# Patient Record
Sex: Female | Born: 1955
Health system: Southern US, Community
[De-identification: ages and names within clinical notes are randomized; demographics above are authoritative.]

## PROBLEM LIST (undated history)

## (undated) DIAGNOSIS — Z765 Malingerer [conscious simulation]: Secondary | ICD-10-CM

## (undated) DIAGNOSIS — G8929 Other chronic pain: Secondary | ICD-10-CM

## (undated) DIAGNOSIS — I509 Heart failure, unspecified: Secondary | ICD-10-CM

## (undated) DIAGNOSIS — M549 Dorsalgia, unspecified: Secondary | ICD-10-CM

## (undated) DIAGNOSIS — I10 Essential (primary) hypertension: Secondary | ICD-10-CM

## (undated) DIAGNOSIS — M1990 Unspecified osteoarthritis, unspecified site: Secondary | ICD-10-CM

## (undated) DIAGNOSIS — F1910 Other psychoactive substance abuse, uncomplicated: Secondary | ICD-10-CM

## (undated) DIAGNOSIS — J449 Chronic obstructive pulmonary disease, unspecified: Secondary | ICD-10-CM

## (undated) DIAGNOSIS — R569 Unspecified convulsions: Secondary | ICD-10-CM

## (undated) HISTORY — PX: CATARACT EXTRACTION: SUR2

## (undated) HISTORY — PX: CHOLECYSTECTOMY: SHX55

## (undated) HISTORY — PX: HERNIA REPAIR: SHX51

---

## 2000-08-26 ENCOUNTER — Ambulatory Visit (HOSPITAL_COMMUNITY): Admission: RE | Admit: 2000-08-26 | Discharge: 2000-08-26 | Payer: Self-pay | Admitting: Internal Medicine

## 2000-08-26 ENCOUNTER — Encounter: Payer: Self-pay | Admitting: General Surgery

## 2001-09-03 ENCOUNTER — Encounter: Payer: Self-pay | Admitting: Internal Medicine

## 2001-09-03 ENCOUNTER — Ambulatory Visit (HOSPITAL_COMMUNITY): Admission: RE | Admit: 2001-09-03 | Discharge: 2001-09-03 | Payer: Self-pay | Admitting: Internal Medicine

## 2001-11-09 ENCOUNTER — Emergency Department (HOSPITAL_COMMUNITY): Admission: EM | Admit: 2001-11-09 | Discharge: 2001-11-10 | Payer: Self-pay | Admitting: Emergency Medicine

## 2002-09-14 ENCOUNTER — Ambulatory Visit (HOSPITAL_COMMUNITY): Admission: RE | Admit: 2002-09-14 | Discharge: 2002-09-14 | Payer: Self-pay | Admitting: Internal Medicine

## 2002-09-14 ENCOUNTER — Encounter: Payer: Self-pay | Admitting: Internal Medicine

## 2003-04-25 ENCOUNTER — Ambulatory Visit (HOSPITAL_COMMUNITY): Admission: RE | Admit: 2003-04-25 | Discharge: 2003-04-25 | Payer: Self-pay | Admitting: Internal Medicine

## 2003-12-14 ENCOUNTER — Ambulatory Visit (HOSPITAL_COMMUNITY): Admission: RE | Admit: 2003-12-14 | Discharge: 2003-12-14 | Payer: Self-pay | Admitting: Family Medicine

## 2004-11-19 ENCOUNTER — Ambulatory Visit (HOSPITAL_COMMUNITY): Admission: RE | Admit: 2004-11-19 | Discharge: 2004-11-19 | Payer: Self-pay | Admitting: Internal Medicine

## 2005-02-04 DIAGNOSIS — F1910 Other psychoactive substance abuse, uncomplicated: Secondary | ICD-10-CM

## 2005-02-04 DIAGNOSIS — Z765 Malingerer [conscious simulation]: Secondary | ICD-10-CM

## 2005-02-04 HISTORY — DX: Other psychoactive substance abuse, uncomplicated: F19.10

## 2005-02-04 HISTORY — DX: Malingerer (conscious simulation): Z76.5

## 2005-07-18 ENCOUNTER — Ambulatory Visit (HOSPITAL_COMMUNITY): Admission: RE | Admit: 2005-07-18 | Discharge: 2005-07-18 | Payer: Self-pay | Admitting: Internal Medicine

## 2005-09-11 ENCOUNTER — Emergency Department (HOSPITAL_COMMUNITY): Admission: EM | Admit: 2005-09-11 | Discharge: 2005-09-12 | Payer: Self-pay | Admitting: Emergency Medicine

## 2005-10-30 ENCOUNTER — Emergency Department (HOSPITAL_COMMUNITY): Admission: EM | Admit: 2005-10-30 | Discharge: 2005-10-30 | Payer: Self-pay | Admitting: Emergency Medicine

## 2005-11-03 ENCOUNTER — Inpatient Hospital Stay (HOSPITAL_COMMUNITY): Admission: EM | Admit: 2005-11-03 | Discharge: 2005-11-11 | Payer: Self-pay | Admitting: Emergency Medicine

## 2005-11-06 ENCOUNTER — Ambulatory Visit: Payer: Self-pay | Admitting: Cardiology

## 2005-12-16 ENCOUNTER — Inpatient Hospital Stay (HOSPITAL_COMMUNITY): Admission: EM | Admit: 2005-12-16 | Discharge: 2005-12-19 | Payer: Self-pay | Admitting: Emergency Medicine

## 2005-12-30 ENCOUNTER — Emergency Department (HOSPITAL_COMMUNITY): Admission: EM | Admit: 2005-12-30 | Discharge: 2005-12-30 | Payer: Self-pay | Admitting: Emergency Medicine

## 2006-08-11 ENCOUNTER — Inpatient Hospital Stay (HOSPITAL_COMMUNITY): Admission: AD | Admit: 2006-08-11 | Discharge: 2006-08-15 | Payer: Self-pay | Admitting: Psychiatry

## 2006-08-11 ENCOUNTER — Emergency Department (HOSPITAL_COMMUNITY): Admission: EM | Admit: 2006-08-11 | Discharge: 2006-08-11 | Payer: Self-pay | Admitting: Emergency Medicine

## 2006-08-11 ENCOUNTER — Ambulatory Visit: Payer: Self-pay | Admitting: Psychiatry

## 2006-09-25 ENCOUNTER — Ambulatory Visit: Payer: Self-pay | Admitting: Cardiology

## 2006-12-10 ENCOUNTER — Emergency Department (HOSPITAL_COMMUNITY): Admission: EM | Admit: 2006-12-10 | Discharge: 2006-12-10 | Payer: Self-pay | Admitting: Emergency Medicine

## 2010-02-25 ENCOUNTER — Encounter: Payer: Self-pay | Admitting: Internal Medicine

## 2010-06-19 NOTE — Consult Note (Signed)
NAME:  [name], [name]                 ACCOUNT NO.:  [id number]   MEDICAL RECORD NO.:  [id number]          PATIENT TYPE:  IPS   LOCATION:  0504                          FACILITY:  [facility]   PHYSICIAN:  Corinna L. Lendell Caprice, MDDATE OF BIRTH:  10-Nov-1955   DATE OF CONSULTATION:  08/14/2006  DATE OF DISCHARGE:                                 CONSULTATION   REQUESTING PHYSICIAN:  Geoffery Lyons, M.D.   REASON FOR CONSULTATION:  Uncontrolled hypertension   IMPRESSIONS AND RECOMMENDATIONS:  1. Uncontrolled hypertension:  The patient reports that she takes      clonidine 0.2 mg q.i.d. rather than b.i.d. which is what she is on      here.  I recommend changing to q.i.d.  She also was on [facility]      previously which I recommend resuming.  She reports that her blood      pressure even on this regimen has been high in the past and we may      need to adjust her medications accordingly.  2. Abnormal TSH:  I recommend checking a free T4, free T3 to rule out      hypothyroidism, but she does not have symptoms of hypothyroidism      clinically.  3. History of polysubstance abuse.  4. Degenerative joint disease.   HISTORY OF PRESENT ILLNESS:  [name] is a 55 year old white female who  was admitted to [facility] with complaints of wanting detox from  alcohol.  She has a history of chronic narcotic use but apparently has  been fired from her primary care physician and is unable to find anyone  who will prescribe her narcotics.  She reports that she started drinking  4 to 6 beers a day due to the pain as she was unable to get any pain  medications.  Apparently her primary care physician fired her for  illicit drug use.  She was found to have a urine drug screen positive  for benzodiazepines and THC during this hospitalization and apparently  has had issues with urine drug screens positive for cocaine in the past.  Currently she is complaining of back pain and leg pain.   PAST MEDICAL HISTORY:  As  above.   MEDICATIONS:  Multivitamin a day, thiamine, Librium taper, nicotine 21  mg daily, clonidine 0.2 mg p.o. b.i.d.  Neurontin as needed.  Seroquel  as needed.   SOCIAL HISTORY:  As above.  She smokes cigarettes.   FAMILY HISTORY:  Noncontributory.   REVIEW OF SYSTEMS:  As above, otherwise negative.   PHYSICAL EXAMINATION:  Vital signs:  Her temperature is 97.1, pulse 80,  respiratory rate 20, blood pressure most recently was 142/95, earlier  today was 201/128 and has ranged from 140 systolic to 208 systolic, 83  to 829 diastolic.  Pulse is 77, respiratory rate 20.  In general, the  patient is an anxious appearing, sometimes tearful white female who is  overweight.  HEENT:  Normocephalic, atraumatic.  Pupils are equal,  round, reactive to light.  Sclerae are anicteric.  Moist mucous  membranes.  Neck  is supple.  No carotid bruits.  Lungs are clear to  auscultation bilaterally without wheezes, rhonchi or rales.  CARDIOVASCULAR:  Regular rate and rhythm without murmurs, gallops or  rubs.  ABDOMEN:  Normal bowel sounds, soft, obese, nontender.  GU and  RECTAL:  Deferred.  EXTREMITIES:  No clubbing, cyanosis or edema.  NEUROLOGIC:  Alert and oriented.  Cranial nerves and sensorimotor exam  are intact.  SKIN:  No rash.   LABS:  TSH is 8.464.  CBC is significant for hemoglobin of 16,  hematocrit 48, otherwise unremarkable.  Basic metabolic panel  significant for a sodium 131, otherwise unremarkable.  Urine drug screen  as above.  Blood alcohol level on July 7th was 121.  Urinalysis showed  trace ketones, specific gravity less than 1.005, negative blood,  negative protein, negative nitrite, negative leukocyte esterase.   ASSESSMENT AND PLAN:  As above.      Corinna L. Lendell Caprice, MD  Electronically Signed     CLS/MEDQ  D:  08/14/2006  T:  08/15/2006  Job:  [id number]

## 2010-06-22 NOTE — Procedures (Signed)
NAMECOURTNEI, RUDDELL                 ACCOUNT NO.:  [id number]   MEDICAL RECORD NO.:  [id number]          PATIENT TYPE:  INP   LOCATION:  IC04                          FACILITY:  [facility]   PHYSICIAN:  Gerrit Friends. Dietrich Pates, MD, FACCDATE OF BIRTH:  1955-07-30   DATE OF PROCEDURE:  11/06/2005  DATE OF DISCHARGE:                                  ECHOCARDIOGRAM   CLINICAL DATA:  A 55 year old woman with respiratory failure and substance  abuse; rule out PFO.  M-mode aorta 2.9, left atrium 3.0, septum 1.6,  posterior wall 1.4, LV diastole 3.4, LV systole 2.4.   1. Technically suboptimal, but adequate echocardiographic study.  2. Normal left atrium, right atrium and right ventricle.  3. Normal mitral and tricuspid valves.  4. Normal aortic valve and proximal ascending aorta; high flow velocity      across the aortic valve by Doppler.  5. Normal internal dimension of the left ventricle; moderate LVH present;      hyperdynamic regional and global LV systolic function with cavity      obliteration noted.  6. Physiologic posterior pericardial effusion.  7. Normal IVC.  8. Contrast study was of poor quality; there was no evidence for shunting      at the atrial or ventricular level.      Gerrit Friends. Dietrich Pates, MD, [facility]  Electronically Signed     RMR/MEDQ  D:  11/06/2005  T:  11/07/2005  Job:  [id number]

## 2010-06-22 NOTE — Group Therapy Note (Signed)
[name], [name]                 ACCOUNT NO.:  [id number]   MEDICAL RECORD NO.:  [id number]          PATIENT TYPE:  INP   LOCATION:  A307                          FACILITY:  [facility]   PHYSICIAN:  Margaretmary Dys, M.D.DATE OF BIRTH:  Aug 24, 1955   DATE OF PROCEDURE:  11/10/2005  DATE OF DISCHARGE:                                   PROGRESS NOTE   SUBJECTIVE:  The patient still complains of pain everywhere.  She says her  back hurts.  She gave me a list of all the narcotics she is on, including  Dilaudid 8 mg every 4 hours, Roxicodone.  The patient would like to have  this given to her here.  I have advised her that she will need a pain clinic  consult as soon as she is discharged.  She used to be a patient of Dr. Sherwood Ellis  and was discharged from the practice due to concerns about her drug use.   OBJECTIVE:  Conscious, alert, comfortable, not in acute distress.  VITAL SIGNS:  Her blood pressure was 126/70, pulse of 84, respirations 20,  temperature 97.2, oxygen saturation was 93% on room air.  HEENT:  Normocephalic, atraumatic.  Oral mucosa was moist with no exudates.  NECK:  Supple.  No JVD.  LUNGS:  Clear equally.  Good air entry bilaterally.  HEART:  S1, S2 regular.  No S3, S4 gallops or rubs.  ABDOMEN:  Soft and nontender.  Bowel sounds positive.  EXTREMITIES:  No edema.   LABORATORY/DIAGNOSTIC DATA:  White blood cell count was 7.7, hemoglobin of  11.6, hematocrit 34.8, platelet count was 220,000 with no left shift.  BMET  was normal.   ASSESSMENT AND PLAN:  Acute respiratory failure secondary to drug overdose,  likely benzodiazepine combined with cocaine.  The patient is much better and  this is resolved.  She also had malignant hypertension likely related to  cocaine toxicity.  Her blood pressure medications have been restarted with  excellent control thus far.  The patient has chronic pain syndrome and has  been having a lot of difficulty with her pain control.  She is  requesting  multiple medications.   Her home situation is fairly bad and that is why we are holding off  discharging her until she is seen by social services tomorrow.  SHE is also  still undergoing physical and occupational therapy.   I would change her Dilaudid to every 3 hours.   Clearly, the patient's part of the overwhelming concern will be for her to  get a pain consult to help streamline her narcotic analgesia when she is  discharged.      Margaretmary Dys, M.D.  Electronically Signed     AM/MEDQ  D:  11/10/2005  T:  11/10/2005  Job:  [id number]

## 2010-06-22 NOTE — Group Therapy Note (Signed)
NAMEBRENTLEY, HORRELL                 ACCOUNT NO.:  [id number]   MEDICAL RECORD NO.:  [id number]          PATIENT TYPE:  INP   LOCATION:  A307                          FACILITY:  [facility]   PHYSICIAN:  Edward L. Juanetta Gosling, M.D.DATE OF BIRTH:  12/23/1955   DATE OF PROCEDURE:  11/08/2005  DATE OF DISCHARGE:                                   PROGRESS NOTE   Ms. Hosking is awake and alert, says she has significant pain and needs  something for it.  She has no other complaints.  She says she is breathing  okay.   Her pH 7.38, pCO2 of 45, pO2 of 124.  Her white count 10,900, hemoglobin  11.6, platelets 200,000.  Electrolytes show potassium 3.4.   ASSESSMENT:  She is doing fairly well.   PLAN:  I think from a strictly respiratory point of view she could move from  the intensive care unit.  She may need further evaluation by mental health,  etc.      Oneal Deputy. Juanetta Gosling, M.D.  Electronically Signed     ELH/MEDQ  D:  11/08/2005  T:  11/09/2005  Job:  [id number]

## 2010-06-22 NOTE — Consult Note (Signed)
NAME:  [name], [name]                 ACCOUNT NO.:  [id number]   MEDICAL RECORD NO.:  [id number]          PATIENT TYPE:  INP   LOCATION:  IC04                          FACILITY:  [facility]   PHYSICIAN:  Edward L. Juanetta Gosling, M.D.DATE OF BIRTH:  1955-06-05   DATE OF CONSULTATION:  11/05/2005  DATE OF DISCHARGE:                                   CONSULTATION   REASON FOR CONSULTATION:  Respiratory failure.   [name] is a 55 year old who was admitted to the hospital on the 30th who  had what may have been a seizure at her home.  When she was found by EMS,  she was unresponsive.  She received Narcan and then became alert.  She then  began having shaking of her hand and a grand mal seizure at that time.  Since that time, she was then brought to the emergency room.  She has not  been responsive since she came to the emergency room.  When she came to the  ER, she eventually was able to be sedated, paralyzed, intubated and placed  on mechanical ventilation.  Her examination in the emergency room also  showed that she had Xanax tablets in her underwear.  Urine tox screen at  that time showed cocaine.  There apparently has been a history of  polysubstance abuse, particularly narcotics and she has apparently been to  multiple emergency rooms seeking narcotics.  She was hypertensive when she  came to the emergency room and had been started on nitroglycerin.  She also  was noted at the time of admission to have a bruise on her chin and left  thigh.  She is still unresponsive and has had increasing air hunger despite  her ventilation.  She had become more hypoxic despite being on the  ventilator. Recently has been started on 100% O2.  Has been noted to have a  somewhat elevated D-dimer and has been anticoagulated.   Her past medical history all from the medical record.  It is positive for  hypertension, degenerative joint disease, depression, gastroesophageal  reflux disease, osteoarthritis, sleep apnea,  lumbar radiculitis and panic  disorder.  She had apparently been on the following medications and most of  the doses are not known.  She had been on Clonidine, Mavik, Roxicet,  Dilaudid, Xanax, Cymbalta, Prempro, Proscar.  She is allergic to PENICILLIN.  Her family and social history are pretty much unknown.  Review of systems  otherwise is also unknown.   Her physical examination now shows that she has a respiratory rate of about  30.  She is intubated on a ventilator and sedated.  Blood pressure is  128/80, pulse is about 90.  Her respirations as mentioned are about 30.  She  is afebrile.  Her HEENT shows her pupils are poorly reactive to light.  Her  mucous membranes are moist.  I cannot hear fundi.  Tympanic membranes are  intact.  Her neck is supple without masses.  She does not have any jugular  venous distention.  She does not have any carotid bruits.  Her chest shows  decreased breath sounds and rhonchi bilaterally more on the left then on the  right.  Her heart is regular without gallop.  Her abdomen is soft.  No  masses are felt.  Bowel sounds are present, sluggish.  No masses are  palpable.  Her extremities showed no edema.  CNS shows that she did not have  any focal findings but she was sedated.   Her hospital course thus far has been that she has remained sedated, been on  the ventilator, became had more problems with her oxygenation which is now  100% on O2.  She has been treated already with charcoal, sorbitol and her  chest x-ray now is actually fairly clear.  She is on CMV ventilation which  is appropriate without a significant  finding on chest x-ray.  I think we need to be concerned about pulmonary  embolus.  She had been anticoagulated which of course is appropriate.  All  of this may be related to acute cocaine intoxication.  She also had multiple  other substances found in her urine drug screen.  Thanks for allowing me to  her with you.      Edward L. Juanetta Gosling,  M.D.  Electronically Signed     ELH/MEDQ  D:  11/05/2005  T:  11/07/2005  Job:  [id number]

## 2010-06-22 NOTE — Group Therapy Note (Signed)
[name], [name]                 ACCOUNT NO.:  [id number]   MEDICAL RECORD NO.:  [id number]          PATIENT TYPE:  INP   LOCATION:  A307                          FACILITY:  [facility]   PHYSICIAN:  Edward L. Juanetta Gosling, M.D.DATE OF BIRTH:  [birth year]   DATE OF PROCEDURE:  11/07/2005  DATE OF DISCHARGE:                                   PROGRESS NOTE   [name] extubated herself today.  She has tolerated things well so far.  She says that she has a significant amount of pain and needs something for  pain and has very limited understanding that it was the use of these  medications that apparently got her into respiratory failure.  She says that  someone else gave her the cocaine that was in her system without her  knowledge.   PHYSICAL EXAMINATION:  GENERAL:  She is awake and alert, complaining of  pain.  CHEST:  Her chest is actually fairly clear.  CARDIAC:  Her heart is regular.  ABDOMEN:  Soft.  EXTREMITIES:  No edema.   ASSESSMENT:  Since she has been extubated and since thus far she is  tolerating it fairly well, I think we should go ahead and see how she does.  She may well now that she is over most of the polysubstance overdose be able  to manage off ventilator support.  She does have chronic obstructive  pulmonary disease, which may make things more difficult but, hopefully, she  will be able to do that.      Edward L. Juanetta Gosling, M.D.  Electronically Signed     ELH/MEDQ  D:  11/08/2005  T:  11/09/2005  Job:  [id number]

## 2010-06-22 NOTE — Group Therapy Note (Signed)
NAME:  [name], [name]                 ACCOUNT NO.:  [id number]   MEDICAL RECORD NO.:  [id number]          PATIENT TYPE:  INP   LOCATION:  IC04                          FACILITY:  [facility]   PHYSICIAN:  Edward L. Juanetta Gosling, M.D.DATE OF BIRTH:  1955/06/28   DATE OF PROCEDURE:  DATE OF DISCHARGE:                                   PROGRESS NOTE   [name] remains unresponsive. She is intubated on a ventilator. Her chest x-  ray this morning shows bibasilar atelectasis. She is schedule is scheduled  for CAT scan on her chest. She remains on Clonidine, Lovenox, Xopenex,  Atrovent, Levaquin, Cardene, Protonix, Hydralazine, Morphine, Diprivan, and  Apresoline.  Her heart rate is 105, her blood pressure about 160/80.   LABORATORY DATA:  Her lab work shows she is 80% FiO2, 600 rate of 14 shows  PA 7.47, pCO2 of 27, __________ 22 of 85. Magnesium 1.7, white count is  23,300, hemoglobin 15, platelets are 302 and B-met shows normal  electrolytes, glucose 117.   ASSESSMENT:  She has an exasperatory failure, that is multi-factorial,  probably a multiple drug overdose, plus use of illicit drugs such as  cocaine. She has hypertensive urgency, I suspect from the cocaine. She has  very poor oxygenation. She is scheduled for CAT scan today. She is going to  continue on her treatments, including Lovenox. She has a little bit more  atelectasis on the x-ray, so we are going to need to watch that. Otherwise,  we will continue the treatments and follow through.      Edward L. Juanetta Gosling, M.D.  Electronically Signed     ELH/MEDQ  D:  11/06/2005  T:  11/07/2005  Job:  [id number]

## 2010-06-22 NOTE — Group Therapy Note (Signed)
[name], [name]                 ACCOUNT NO.:  [id number]   MEDICAL RECORD NO.:  [id number]          PATIENT TYPE:  INP   LOCATION:  A307                          FACILITY:  [facility]   PHYSICIAN:  Margaretmary Dys, M.D.DATE OF BIRTH:  November 27, 1955   DATE OF PROCEDURE:  11/09/2005  DATE OF DISCHARGE:                                   PROGRESS NOTE   SUBJECTIVE:  The patient is doing much better.  She is awake, alert and  eating, has no other complaints, thinks somebody may have intentionally  given her the drugs to use, as she declines intentionally taking an  overdose.   OBJECTIVE:  Conscious, alert, comfortable, not in acute distress.  VITAL SIGNS:  Blood pressure was 127/72, pulse of 89, respiration of 20,  temperature 97.8.  HEENT:  Normocephalic, atraumatic.  Oral mucosa was moist with no exudates.  NECK:  Supple, no JVD or lymphadenopathy.  LUNGS:  Clear clinically, good air air bilaterally.  HEART:  S1, S2, regular, no S3, gallops or rubs.  ABDOMEN:  Soft, nontender, bowel sounds positive.  No masses palpable.  EXTREMITIES:  No pitting pedal edema and no calf effusion or tenderness was  noted.   LABORATORY/DIAGNOSTIC DATA:  White blood count was 7.9, hemoglobin 11.6,  hematocrit 34.6, platelet count was 200.  BMET shows a mild hypokalemia with  potassium of 3.4.   ASSESSMENT AND PLAN:  [name] is a female admitted with a drug overdose,  including cocaine and benzodiazepine.  The patient went into respiratory  failure, was subsequently intubated in the emergency room.  The patient has  done fairly well, was successfully extubated.  It is unclear what happened.  The patient does have a lot of social issues, including chronic pain  syndrome, for which she has been on narcotics for more than 10 years.  The  plan is to continue to watch her.  We will discontinue her telemetry.  We  will request physical  therapy/occupational therapy to see her, and ask social services to help  [place]  prior to her being discharged.  She is otherwise comfortable.  We will  replace her potassium and check a level in the morning.      Margaretmary Dys, M.D.  Electronically Signed     AM/MEDQ  D:  11/09/2005  T:  11/10/2005  Job:  [id number]
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
## 2010-06-22 NOTE — H&P (Signed)
NAME:  [name], [name]                 ACCOUNT NO.:  [id number]   MEDICAL RECORD NO.:  [id number]          PATIENT TYPE:  INP   LOCATION:  IC04                          FACILITY:  [facility]   PHYSICIAN:  Margaretmary Dys, M.D.DATE OF BIRTH:  [birth year]   DATE OF ADMISSION:  11/03/2005  DATE OF DISCHARGE:  LH                                HISTORY & PHYSICAL   PRIMARY CARE PHYSICIAN:  The patient is unassigned.   ADMISSION DIAGNOSES:  1. Altered mental status.  2. Acute respiratory failure requiring mechanical ventilation.  3. Probable cocaine toxicity plus polysubstance abuse.  4. History of severe depression.  5. Severe hypertensive crisis likely secondary to cocaine use.   CHIEF COMPLAINT:  Altered mental status.   HISTORY OF PRESENT ILLNESS:  Please note that information was not obtainable  from the patient as she was paralyzed, sedated and on mechanical ventilation  due to severe respiratory distress when she arrived in the emergency room.   Based on the records from the ED and from the EMS, the patient is a 55-year-  old Caucasian female who was in her residence where she reportedly had  symptoms consistent with a seizure.  Upon arrival by EMS, the patient was  noted to be unresponsive.  She received Narcan and became alert.  Her hand  began shaking and then she was noted to have a grand mal seizure.  The  patient reported not to be responsive since.  Attempts were made to intubate  in the field but her jaws were clenched.   On arrival here, Dr. Hilario Quarry, the emergency room physician,  subsequently intubated her.  They found some tablets of Xanax in her  underpants.   A follow-up evaluation including urine toxicology screen is positive for  cocaine and the results are discussed below.  The patient has had history of  polysubstance abuse in the past, especially to narcotic analgesia and has  had a few emergency room visits including most recently to [facility]  requesting for narcotic analgesia.  She used to be a patient of Dr.  Sherwood Gambler but apparently Dr. Sherwood Gambler declined seeing her anymore because of drug  and substance abuse problem.  The patient was also noted to be severely  hypertensive and was started on nitroglycerin infusion with some improvement  in her blood pressure.   She also has a bruise on her chin and left thigh.   REVIEW OF SYSTEMS:  Not obtainable due to patient's mental status.   PAST MEDICAL HISTORY:  1. From the records, hypertension.  2. Degenerative joint disease.  3. Depression.  4. Gastroesophageal reflux disease.  5. Osteoarthritis.  6. Sleep apnea.  7. Lumbar radiculitis.  8. Panic disorder.   MEDICATIONS:  She is on Clonidine orally, dose unknown, Mavik orally,  Roxicet 30 mg, Dilaudid oral 8 mg, Xanax oral, Cymbalta oral, Prempro oral,  Clonidine oral and Mavik oral.   ALLERGIES:  The patient is allergic to PENICILLIN.   FAMILY/SOCIAL HISTORY:  Not obtainable as mentioned above.   PHYSICAL EXAMINATION:  The patient was sedated, paralyzed  on mechanical  ventilation.  Pupils were dilated and unresponsive about 8 mm.  Neck was  supple.  No JVD.  Lungs were clear clinically with good air entry  bilaterally.  Heart S1-S2, regular.  No S3, S4, gallops or rubs.  Abdomen  was soft, nontender, obese.  Bowel sounds were positive.  No masses  palpable.  EXTREMITIES:  No edema, no induration or tenderness.  CNS EXAM:  The patient was heavily sedated and paralyzed on mechanical  ventilation.   LABORATORY/DIAGNOSTIC DATA:  Her blood gas post intubation on FIO2 of 100%,  rate of 12, tidal volumes of 600.  She had a pH of 7.44, pCO2 of 36.2, pO2  is 263, bicarbonate was 24.5.  Oxygen saturation was 99.6%.   White blood cell count was 15.5, hemoglobin of 19.1, hematocrit 56.6,  platelet count was 349 with neutrophils of 79%.  PT was 13.8, INR 1.0.  Sodium 136, potassium 2.7, chloride of 98, CO2 22, glucose 165, BUN of  5,  creatinine was 1.0.  Total bilirubin of 1.4, AST 29, ALT of 17.  Cardiac  enzymes were negative but myoglobin was greater than 500.  Urine toxicology  screen was positive for benzodiazepines, cocaine and marijuana.  Alcohol  level was less than 5.  Urinalysis showed small ketones and positive for  protein.  Microscopy showed some WBC cast.  Blood cultures are pending.   Chest x-ray obtained postintubation showed that the ET tube needed to be  adjusted.  The lung fields are unremarkable.   A CT scan of the head showed that there was no evidence of acute  intracranial abnormality.   ASSESSMENT/PLAN:  Ms. [name] is a 55 year old Caucasian female who likely  overdosed on multiple substances including benzodiazepines and cocaine.  It  is unclear what else she may have overdosed on.  I will go ahead and lavage  her stomach with activated charcoal with sorbitol.  She will be admitted to  intensive care unit.  I agree she will continue with her nitroglycerin  infusion.  Blood pressure was 240/140 when she came in.  We will try to  avoid beta blockers in her due to risk of rebound hypertension.  The patient  was on clonidine at home and it may appear that she may also have some  rebound hypertension from the clonidine.  It is unclear if she has been  compliant with the clonidine or not.   We will put an arterial line in her for closer blood pressure monitoring.  She does not have evidence of an acute cerebrovascular accident.  At this  time, we will obtain cardiac enzymes on her, continue to monitor myoglobin  for any evidence of rhabdomyolysis.  We will hydrate her aggressively.   We will hold all of her other home medications at this time.  The patient  remains critically ill with significant concerns of her prognosis.   We will give her Ativan 2 mg IV q.2h. for agitation or restlessness and also  put her on propofol infusion.  We will give her Protonix gastrointestinal prophylaxis and  Lovenox.  Seeing  as I am unsure if she has been very compliant with her clonidine, we will  restart her on clonidine 0.3 mg .   There were no family members available to discuss her situation and status.  I will review her againin the intensive care unit in the next half hour.  Total amount of time spent on her critical care was one hour.  Margaretmary Dys, M.D.  Electronically Signed     AM/MEDQ  D:  11/03/2005  T:  11/03/2005  Job:  [id number]

## 2010-06-22 NOTE — Discharge Summary (Signed)
NAMELOUNETTE, SLOAN                 ACCOUNT NO.:  [id number]   MEDICAL RECORD NO.:  [id number]          PATIENT TYPE:  INP   LOCATION:  A307                          FACILITY:  [facility]   PHYSICIAN:  Hanley Hays. Dechurch, M.D.DATE OF BIRTH:  07/12/1955   DATE OF ADMISSION:  11/03/2005  DATE OF DISCHARGE:  10/08/2007LH                                 DISCHARGE SUMMARY   DIAGNOSES:  1. Acute respiratory failure requiring mechanical ventilation.  2. Probable seizure.  3. Hypertension.  4. Polysubstance use/abuse.  5. Disability secondary to degenerative joint disease.  6. Anxiety disorder.  7. Gastroesophageal reflux.  8. Tobacco abuse.  9. History of lumbar radiculitis.  10.Sleep apnea.   DISPOSITION:  The patient is being discharged to home.  Follow-up is being  arranged with a new primary care Sharice Harriss.   MEDICATIONS:  1. Roxicodone 30 mg 1-2 2 times daily.  2. Dilaudid 8 mg t.i.d. p.r.n. breakthrough pain.  3. Cymbalta 60 mg daily.  4. Trandolapril 16 mg daily.  5. Clonidine 0.3 mg b.i.d.  6. Levaquin 750 daily complete a 10-day course.  7. Prednisone 20 mg for 3 days then 10 mg x3 days.  8. Xanax 2 mg t.i.d.   Prescriptions were given for Roxicodone number 120, Dilaudid number 60,  Levaquin and prednisone.  The patient states she had all of her other  medications.   HOSPITAL COURSE:  The patient is 55 year old Caucasian female with  degenerative joint disease who previously was followed by Dr. Sherwood Gambler but  because of the question of drug abuse issues he declined further care.  In  any event she presented to the emergency room with altered mental status and  what sounded like a grand mal seizure.  Initial drug screen revealed  evidence of cocaine though the patient strongly denied any use and claimed  that it was being given to her surreptitiously.  She was seen in  consultation.  Her drug screen was also positive for cannabinoids and  benzodiazepines, for which she was  prescribed.  In any event she was  apparently near apneic in the field brought to the emergency room and  intubated.  She has significant hypertension thought likely to be due to  cocaine though drug withdrawal was also of question.  She self extubated the  following day and remained clinically stable.  Her x-ray did reveal an  infiltrate bilateral lower lobes left greater than right thought secondary  to aspiration during her acute event.  She had an echocardiogram performed  which was unremarkable aside from some moderate LVH and hyperdynamic  regional and global LV function.  She remained hemodynamically stable.  Again she was seen by ACT team and felt that she was not at risk from the  standpoint of not felt to be suicidal risk or felt that she would benefit  from outpatient therapy.  She was actually complaining of a fair amount of  joint and leg pains and had been on Dilaudid here in the hospital.  She  requested 240 Roxicodone at the time of discharge as well as Dilaudid and  she would take them as-needed which is how her previous primary care  physician had managed; however, after Denley discussions she was discharged  with the plan as noted above.  She did not have a primary care physician,  this is being arranged and she will not be discharged from the facility  until this is clarified.  Her blood pressures during the last several days  were well managed.  She was ambulating and doing all her own self care.  O2  saturations on room air for the high 90s and she had no respiratory  distress.  Her lung exam at the time of discharge revealed a few scattered  rhonchi but good air movement and much improved over previous evaluation.  She was continued to have some green sputum production.  She had no fever or  anything else remarkable.   LABORATORY DATA:  At time of discharge hemoglobin 11.6, white count 7.7,  normal platelets.  Differential normal.  Potassium 3.6, BUN of 4, creatinine   0.5, normal LFTs.  Cholesterol was done during the hospital stay which was  elevated  with questionable benefit unclear if that was fasting etc.   TOTAL TIME:  About 55 minutes.      Hanley Hays Josefine Class, M.D.  Electronically Signed     FED/MEDQ  D:  11/11/2005  T:  11/12/2005  Job:  [id number]

## 2010-06-22 NOTE — Discharge Summary (Signed)
[name], [name]                 ACCOUNT NO.:  [id number]   MEDICAL RECORD NO.:  [id number]          PATIENT TYPE:  IPS   LOCATION:  0504                          FACILITY:  [facility]   PHYSICIAN:  Geoffery Lyons, M.D.      DATE OF BIRTH:  06/05/1955   DATE OF ADMISSION:  08/11/2006  DATE OF DISCHARGE:  08/15/2006                               DISCHARGE SUMMARY   CHIEF COMPLAINT AND PRESENT ILLNESS:  This was the first admission to  [facility] for this 55 year old single white female  voluntarily admitted.  History of alcohol abuse.  Wanting detox.  Has  been drinking 4-6 beers daily for two weeks.  Began drinking to kill  the pain.  Out of her medication.  Released from her PCP due to illegal  drug use in November.  Has been using the ED to get medication.  Denied  suicidal ideations.  Stated that someone was putting substances in her  foot.  Claimed that she would not even know what these drugs would look  like.   PAST PSYCHIATRIC HISTORY:  First time at [facility].   ALCOHOL/DRUG HISTORY:  Persistent use of alcohol, 4-6 beers daily for  the last two weeks.  Denies or minimizes any other substance use but  drug screen positive for marijuana.   MEDICAL PROBLEMS:  Degenerative disk disease, gastroesophageal reflux,  sleep apnea, arterial hypertension.   MEDICATIONS:  Clonidine 0.2 mg four times a day, Lasix 40 mg per day,  potassium 20 mEq three times a day, Prempro 0.625/25 mg, 1 daily, Xanax  1 mg twice a day as needed, albuterol 2 puffs four times a day as  needed, Percocet 10/325 mg, 1 three times as needed.   PHYSICAL EXAMINATION:  Performed and failed to show any acute findings.   LABORATORY DATA:  TSH 8.464.  CBC with hemoglobin 16.  Sodium 131.  Alcohol level on August 11, 2006 was 121.   MENTAL STATUS EXAM:  Alert cooperative female.  Little eye contact.  Speech clear, normal rate, tempo and production and mood anxious,  depressed.  Affect anxious,  depressed.  Thought processes logical,  coherent and relevant.  Somatically focused, wanting medications,  minimizing her use of substances.  Denied any active suicidal or  homicidal ideation.  No hallucinations.  No delusions.  Cognition well-  preserved.   ADMISSION DIAGNOSES:  AXIS I:  Alcohol abuse; rule out dependence.  Rule  out opiate abuse.  Marijuana abuse.  Benzodiazepine abuse.  Depressive  disorder not otherwise specified.  AXIS II:  No diagnosis.  AXIS III:  Degenerative disk disease, chronic pain, arterial  hypertension, gastroesophageal reflux, sleep apnea.  AXIS IV:  Moderate.  AXIS V:  GAF upon admission 35; highest GAF in the last year 60.   HOSPITAL COURSE:  She was admitted and started in individual and group  psychotherapy.  She was detoxified with Librium.  She was given Ambien  for sleep.  She was given some Seroquel and some ibuprofen.  She  endorsed having degenerative disk disease, back pain, bursitis both  hips, arthritis in the hands.  Her PCP refused to treat her anymore.  She apparently was taking oxycodone 30 mg every six hours and Dilaudid 8  mg every six hours.  She was getting 240 of each.  She was apparently  released from the clinic when UDS showed positive for cocaine.  She  claimed that the cocaine was placed in her foot and she had been going  to the ED on a regular basis to get refills on these medications.  She  claims she used the alcohol to kill the pain when she does not have the  pain pills.  Had been using 4-5 beers every day for a couple of weeks.  Was using Xanax 1 mg three times a day.  Claims she had a seizure when  she ran out of it.  UDS positive for marijuana.  She claims she had seen  a therapist for panic attacks in the past.  She apparently was released  from her primary care Fedrick Cefalu after cocaine and methadone was found in  her urine.  Unable to find another pain management.  She apparently was  referred to [facility] after the primary care [facility]  would not see her and they referred to [facility].  The primary  care Kalup Jaquith would not take her back to treat.  She went through detox.  She endorsed pain.  She kept denying that she abused cocaine and still  minimized the use of marijuana and rationalizes her use of alcohol.  In  bed most of the time.  We pursued detox.  Continued to be somatically  focused.  Her blood pressure was out of control.  Internal medicine was  consulted.  Endorsed anxiety and pain, unable to look past beyond her  pain.  Continued to minimize and deny her substance abuse.  Medications  were adjusted and, by August 15, 2006, she felt better.  Endorsed she was  ready for discharge.  Objectively, she looked better.  Her mood  improved.  Affect was brighter.  She was going to be seen on an  outpatient basis by [facility] and she was going to be  followed at [facility].  She was encouraged by the  fact that she would have someone to manage her pain.   DISCHARGE DIAGNOSES:  AXIS I:  Alcohol abuse.  Opiate dependence.  Marijuana abuse.  Depressive disorder not otherwise specified.  AXIS II:  No diagnosis.  AXIS III:  Degenerative disk disease, chronic pain, arterial  hypertension.  AXIS IV:  Moderate.  AXIS V:  GAF upon discharge 50-55.   DISCHARGE MEDICATIONS:  1. Mavik 8 mg per day.  2. Catapres 0.21 mg four times a day.  3. Neurontin 100 three times as needed for anxiety.   FOLLOWUP:  [facility] and [facility].      Geoffery Lyons, M.D.  Electronically Signed     IL/MEDQ  D:  09/11/2006  T:  09/12/2006  Job:  [id number]

## 2010-06-22 NOTE — Discharge Summary (Signed)
[name], [name]                 ACCOUNT NO.:  [id number]   MEDICAL RECORD NO.:  [id number]          PATIENT TYPE:  INP   LOCATION:  A319                          FACILITY:  [facility]   PHYSICIAN:  Osvaldo Shipper, MD     DATE OF BIRTH:  16-Jan-1956   DATE OF ADMISSION:  12/16/2005  DATE OF DISCHARGE:  11/15/2007LH                               DISCHARGE SUMMARY   The patient does not have a PMD, unfortunately.  She was fired by Dr.  Sherwood Gambler about a couple of months ago.  She was seen by Dr. Ouida Sills, as he  was the unassigned physician when she was admitted the previous time,  but he saw her just for a month; hence, the patient is still unassigned.   Please review H&P dictated by Dr. Corky Downs for details regarding the  patient's presenting illness.   DISCHARGE DIAGNOSES:  1. Acute gastroenteritis, resolved.  2. Chronic back pain requiring high dose narcotics.  3. Hypertension, stable.  4. Possible acid reflux disease.  5. Chest pain of noncardiac etiology.   BRIEF HOSPITAL COURSE:  Briefly, this is a 55 year old Caucasian female  who was actually admitted from September 30 to October 8 for acute  respiratory failure requiring mechanical ventilation.  The patient  apparently was positive for cocaine during that time, and it was thought  that her respiratory failure was likely because of drug abuse.  In any  case, the patient presented this time with nausea, vomiting, and  diarrhea.  She was thought to have gastroenteritis and was admitted to  the hospital, put on IV fluids.  Stool samples were sent, and they were  all negative for C. difficile and other concerning organisms.  The  patient was empirically put on antibiotics which will not be continued  on discharge.   The patient developed mild pulmonary edema as well during the course of  this admission.  She developed some shortness of breath.  She was given  1 dose of Lasix with which she improved.  Her BN-peptide which was also  elevated at 256 came down to 34.  She did have mild elevation in one of  her cardiac markers, but the others were all negative.   She ruled out for acute coronary syndrome.  She did complain  continuously of her sharp retrosternal chest pain.  This prompted a D-  dimer which was mildly positive, and she had a VQ scan done because of  her reported allergy to IV DYE.  The VQ scan was reported as low  probability for PE.  It is thought that her chest pain is likely related  to acid reflux disease, and she was prescribed PPI.   Urine drug screen was done this time which was positive for  benzodiazepines and opiates only.   TSH was normal.  Her white count which was elevated at admission 27,000  also came down to 9.5.  Hemoglobin was also elevated at 18.5 which came  down to 13.5 with no evidence for bleeding.   Chronic pain issues.  The patient is on OxyContin and high doses  of  Dilaudid at home for chronic back pain.  She initially refused to even  get up for physical therapy but with persuasion, she was ambulated  without a walker.  When the patient was to be discharged, she was seen  to be ambulating quite freely without any discomfort.   For her chronic back pain, she has been evaluated by neurosurgeons in  the past, but nobody has been able to offer too many solutions to her.   Other medical issues include hypertension.  She did have some episode of  hypotension in between which prompted a hold on her antihypertensive  agents.  Her blood pressure had been climbing up to the hypertensive  range; hence, we are adjusting her blood pressure pills.   The patient was very concerned that she would not be able to find a PMD.  She requested referral to the health department which we have done so.  I think the issue will be that the patient is on narcotic agents, and  she will not be able to find a physician who will prescribe these high  doses of narcotics.  I spoke with Dr. Sherwood Gambler as well.   He said the  patient was fired because it was the office policy that they would not  treat any patient who does illicit drugs.   DISCHARGE MEDICATIONS:  1. I did prescribe a 7 day course of Dilaudid 8 mg q.4h. p.r.n.  2. A 7 day course of OxyContin 30 mg b.i.d. ****See addendum.***  3. I told her to cut down her Clonidine to 0.1 mg b.i.d.  4. Continue Mavik at 16 mg daily.  5. Prescribed her Prilosec 20 mg daily.  6. Otherwise, I asked her to continue her Cymbalta, her Xanax, and her      other outpatient medications as before.   FOLLOW UP:  1. Referral made to health department, mostly setting her up to see      the unassigned physician for November 12 when she was admitted.  2. She was asked to follow up with her neurosurgeon as well.   DIET:  Heart-healthy diet.   PHYSICAL ACTIVITY:  No restrictions.   Please also note that the patient is also questioned regarding her chest  pain.  She mentioned that she has seen a cardiologist, Dr. Allyson Sabal, in the  past, and she reported a normal stress test within the last 1 year.  She  was asked to seek another appointment to see Dr. Allyson Sabal in the next  couple of months.   Total time spent at discharge about 40 minutes.   ADDENDUM:  The patient presented to the ED a few weeks later because of pain. I was  called by a local pharmacy stating that patient was actually on  Roxicodone 30mg  TID rather than Oxycontin. This was prescibed and the  pharmacist was asked to destroy the other prescription.      Osvaldo Shipper, MD  Electronically Signed     GK/MEDQ  D:  12/19/2005  T:  12/19/2005  Job:  [id number]   cc:   Madelin Rear. Sherwood Gambler, MD  Fax: [phone]   Kingsley Callander. Ouida Sills, MD  Fax: [phone]   Nanetta Batty, M.D.  Fax: [phone]

## 2010-11-20 LAB — BASIC METABOLIC PANEL
Calcium: 9.1
GFR calc Af Amer: >60
GFR calc non Af Amer: >60
Potassium: 4.3
Sodium: 131 — ABNORMAL LOW

## 2010-11-20 LAB — TSH
TSH: 5.329
TSH: 8.464 — ABNORMAL HIGH

## 2010-11-20 LAB — DIFFERENTIAL
Basophils Absolute: 0
Lymphocytes Relative: 30
Lymphs Abs: 3.1
Neutro Abs: 6
Neutrophils Relative %: 58

## 2010-11-20 LAB — RAPID URINE DRUG SCREEN, HOSP PERFORMED
Cocaine: NOT DETECTED
Tetrahydrocannabinol: POSITIVE — AB

## 2010-11-20 LAB — URINALYSIS, ROUTINE W REFLEX MICROSCOPIC
Glucose, UA: NEGATIVE
Nitrite: NEGATIVE
Specific Gravity, Urine: <1.005 — ABNORMAL LOW
pH: 6

## 2010-11-20 LAB — CBC
HCT: 48 — ABNORMAL HIGH
Hemoglobin: 16.6 — ABNORMAL HIGH
RBC: 5.11
RDW: 18.8 — ABNORMAL HIGH
WBC: 10.2

## 2010-11-20 LAB — ETHANOL: Alcohol, Ethyl (B): 121 — ABNORMAL HIGH

## 2010-11-20 LAB — T3, FREE: T3, Free: 2.7 (ref 2.3–4.2)

## 2010-11-20 LAB — T4, FREE: Free T4: 0.87 — ABNORMAL LOW

## 2010-12-19 ENCOUNTER — Emergency Department (HOSPITAL_COMMUNITY)
Admission: EM | Admit: 2010-12-19 | Discharge: 2010-12-19 | Disposition: A | Discharge status: Home / Self Care | Payer: Medicare Other | Attending: Emergency Medicine | Admitting: Emergency Medicine

## 2010-12-19 ENCOUNTER — Emergency Department (HOSPITAL_COMMUNITY): Payer: Medicare Other

## 2010-12-19 DIAGNOSIS — Y92009 Unspecified place in unspecified non-institutional (private) residence as the place of occurrence of the external cause: Secondary | ICD-10-CM | POA: Insufficient documentation

## 2010-12-19 DIAGNOSIS — I10 Essential (primary) hypertension: Secondary | ICD-10-CM | POA: Insufficient documentation

## 2010-12-19 DIAGNOSIS — S82843A Displaced bimalleolar fracture of unspecified lower leg, initial encounter for closed fracture: Secondary | ICD-10-CM

## 2010-12-19 DIAGNOSIS — F17200 Nicotine dependence, unspecified, uncomplicated: Secondary | ICD-10-CM | POA: Insufficient documentation

## 2010-12-19 DIAGNOSIS — M129 Arthropathy, unspecified: Secondary | ICD-10-CM | POA: Insufficient documentation

## 2010-12-19 DIAGNOSIS — I509 Heart failure, unspecified: Secondary | ICD-10-CM | POA: Insufficient documentation

## 2010-12-19 DIAGNOSIS — W010XXA Fall on same level from slipping, tripping and stumbling without subsequent striking against object, initial encounter: Secondary | ICD-10-CM | POA: Insufficient documentation

## 2010-12-19 HISTORY — DX: Essential (primary) hypertension: I10

## 2010-12-19 HISTORY — DX: Heart failure, unspecified: I50.9

## 2010-12-19 HISTORY — DX: Unspecified osteoarthritis, unspecified site: M19.90

## 2010-12-19 MED ORDER — OXYCODONE-ACETAMINOPHEN 5-325 MG PO TABS
1.0000 | ORAL_TABLET | Freq: Once | ORAL | Status: AC
  Administered 2010-12-19: 1 via ORAL
  Filled 2010-12-19: qty 1

## 2010-12-19 MED ORDER — OXYCODONE-ACETAMINOPHEN 5-325 MG PO TABS: 1.0000 | ORAL_TABLET | ORAL | Status: AC | PRN

## 2010-12-19 NOTE — ED Notes (Signed)
Left ankle pain post fall on Monday. Swelling and bruising noted.

## 2010-12-19 NOTE — ED Provider Notes (Signed)
Medical screening examination/treatment/procedure(s) were performed by non-physician practitioner and as supervising physician I was immediately available for consultation/collaboration.   Benny Lennert, MD 12/19/10 2300

## 2010-12-19 NOTE — ED Provider Notes (Signed)
History     CSN: [id number] Arrival date & time: 12/19/2010  5:46 PM   First MD Initiated Contact with Patient 12/19/10 1749      Chief Complaint  Patient presents with  . Ankle Pain    (Consider location/radiation/quality/duration/timing/severity/associated sxs/prior treatment) Patient is a 55 y.o. female presenting with ankle pain. The history is provided by the patient.  Ankle Pain  The incident occurred 2 days ago. The incident occurred at home (She slipped on wet floor in her bathroom,  causing pain and swelling of her left ankle.). The injury mechanism was a fall. The pain is present in the left ankle. The quality of the pain is described as sharp and throbbing. The pain is at a severity of 10/10. The pain is severe. The pain has been constant since onset. Pertinent negatives include no numbness, no loss of sensation and no tingling. Associated symptoms comments: She has been bearing weight,  But very minimal since the injury.. The symptoms are aggravated by bearing weight and activity. She has tried ice, rest and elevation for the symptoms. The treatment provided no relief.    Past Medical History  Diagnosis Date  . Hypertension   . CHF (congestive heart failure)   . Arthritis     Past Surgical History  Procedure Date  . Cesarean section   . Hernia repair   . Cataract extraction   . Cholecystectomy     No family history on file.  History  Substance Use Topics  . Smoking status: Current Some Day Smoker  . Smokeless tobacco: Not on file  . Alcohol Use: Yes    OB History    Grav Para Term Preterm Abortions TAB SAB Ect Mult Living                  Review of Systems  Constitutional: Negative for fever.  HENT: Negative for congestion, sore throat and neck pain.   Eyes: Negative.   Respiratory: Negative for chest tightness and shortness of breath.   Cardiovascular: Negative for chest pain.  Gastrointestinal: Negative for nausea and abdominal pain.    Genitourinary: Negative.   Musculoskeletal: Positive for joint swelling and arthralgias.  Skin: Negative.  Negative for rash and wound.  Neurological: Negative for dizziness, tingling, weakness, light-headedness, numbness and headaches.  Hematological: Negative.   Psychiatric/Behavioral: Negative.     Allergies  Iohexol and Nickel  Home Medications   Current Outpatient Rx  Name Route Sig Dispense Refill  . ALPRAZOLAM 1 MG PO TABS Oral Take 1 mg by mouth 3 (three) times daily.      Marland Kitchen AMLODIPINE BESYLATE 10 MG PO TABS Oral Take 10 mg by mouth daily.      . ASPIRIN EC 81 MG PO TBEC Oral Take 81 mg by mouth at bedtime.      Marland Kitchen VITAMIN B-COMPLEX PO Oral Take 1 tablet by mouth daily.      Marland Kitchen CLONIDINE HCL 0.2 MG PO TABS Oral Take 0.2 mg by mouth 4 (four) times daily.      . DULOXETINE HCL 60 MG PO CPEP Oral Take 60 mg by mouth daily.      . FUROSEMIDE 40 MG PO TABS Oral Take 40 mg by mouth daily.      Marland Kitchen LISINOPRIL 40 MG PO TABS Oral Take 40 mg by mouth daily.      Marland Kitchen METOPROLOL TARTRATE 25 MG PO TABS Oral Take 100 mg by mouth 2 (two) times daily.      Marland Kitchen  OMEPRAZOLE 20 MG PO CPDR Oral Take 20 mg by mouth at bedtime.      . OXYCODONE-ACETAMINOPHEN 10-325 MG PO TABS Oral Take 1 tablet by mouth every 4 (four) hours as needed. *Take one tablet every 4 to 6 hours as needed for pain*     . PHENYTOIN SODIUM EXTENDED 100 MG PO CAPS Oral Take 200-300 mg by mouth 2 (two) times daily. *Take two capsules every morning and three capsules at bedtime*     . POTASSIUM GLUCONATE 595 MG PO CAPS Oral Take 1 capsule by mouth daily.      . TRAZODONE HCL 100 MG PO TABS Oral Take 200 mg by mouth at bedtime.      Marland Kitchen ZOLPIDEM TARTRATE 10 MG PO TABS Oral Take 10 mg by mouth at bedtime.        BP 122/67  Pulse 64  Temp(Src) 98.1 F (36.7 C) (Oral)  Resp 16  Ht 5\' 6"  (1.676 m)  Wt 197 lb (89.359 kg)  BMI 31.80 kg/m2  SpO2 99%  Physical Exam  Nursing note and vitals reviewed. Constitutional: She is oriented to  person, place, and time. She appears well-developed and well-nourished.  HENT:  Head: Normocephalic.  Eyes: Conjunctivae are normal.  Neck: Normal range of motion.  Cardiovascular: Normal rate and intact distal pulses.  Exam reveals no decreased pulses.   Pulses:      Dorsalis pedis pulses are 2+ on the right side, and 2+ on the left side.       Posterior tibial pulses are 2+ on the right side, and 2+ on the left side.  Pulmonary/Chest: Effort normal.  Musculoskeletal: She exhibits edema and tenderness.       Left ankle: She exhibits decreased range of motion, swelling and ecchymosis. She exhibits normal pulse. tenderness. Lateral malleolus and medial malleolus tenderness found. No proximal fibula tenderness found. Achilles tendon normal.  Neurological: She is alert and oriented to person, place, and time. No sensory deficit.  Skin: Skin is warm, dry and intact.    ED Course  Procedures (including critical care time)  Labs Reviewed - No data to display Dg Ankle Complete Left  12/19/2010  *RADIOLOGY REPORT*  Clinical Data: Larey Seat.  Left ankle pain.  LEFT ANKLE COMPLETE - 3+ VIEW  Comparison: None  Findings: There is a nondisplaced oblique coursing fracture of the distal ulnar shaft at and above the level of the ankle mortise. There is also a transverse fracture through the medial malleolus at the level of the ankle mortise.  No definite fracture of the posterior tibia.  The talus is intact.  The subtalar joints are maintained.  IMPRESSION: Bimalleolar ankle fractures.  Original Report Authenticated By: P. Loralie Champagne, M.D.     No diagnosis found.    MDM  Call to Dr. Romeo Apple - recommends Cam walker,  Patient has walker at home.  Will see in office tomorrow.          Candis Musa, PA 12/19/10 [phone]

## 2010-12-19 NOTE — ED Notes (Signed)
Left foot elevated and ice applied. 

## 2010-12-19 NOTE — ED Notes (Signed)
Pt sitting in wheelchair at this time with left ankle injury post fall on Monday night. Swelling and bruising noted to left ankle. Pt c/o pain that radiates from ankle to knee. Pt has positive pulse and cap refill present.

## 2011-12-16 ENCOUNTER — Emergency Department (HOSPITAL_COMMUNITY)
Admission: EM | Admit: 2011-12-16 | Discharge: 2011-12-16 | Disposition: A | Discharge status: Home / Self Care | Payer: Medicare Other | Attending: Emergency Medicine | Admitting: Emergency Medicine

## 2011-12-16 ENCOUNTER — Emergency Department (HOSPITAL_COMMUNITY): Payer: Medicare Other

## 2011-12-16 ENCOUNTER — Encounter (HOSPITAL_COMMUNITY): Payer: Self-pay

## 2011-12-16 DIAGNOSIS — I10 Essential (primary) hypertension: Secondary | ICD-10-CM | POA: Insufficient documentation

## 2011-12-16 DIAGNOSIS — M1710 Unilateral primary osteoarthritis, unspecified knee: Secondary | ICD-10-CM | POA: Insufficient documentation

## 2011-12-16 DIAGNOSIS — Z7982 Long term (current) use of aspirin: Secondary | ICD-10-CM | POA: Insufficient documentation

## 2011-12-16 DIAGNOSIS — M129 Arthropathy, unspecified: Secondary | ICD-10-CM | POA: Insufficient documentation

## 2011-12-16 DIAGNOSIS — Z79899 Other long term (current) drug therapy: Secondary | ICD-10-CM | POA: Insufficient documentation

## 2011-12-16 DIAGNOSIS — M2550 Pain in unspecified joint: Secondary | ICD-10-CM | POA: Insufficient documentation

## 2011-12-16 DIAGNOSIS — IMO0002 Reserved for concepts with insufficient information to code with codable children: Secondary | ICD-10-CM | POA: Insufficient documentation

## 2011-12-16 DIAGNOSIS — F17200 Nicotine dependence, unspecified, uncomplicated: Secondary | ICD-10-CM | POA: Insufficient documentation

## 2011-12-16 DIAGNOSIS — M1711 Unilateral primary osteoarthritis, right knee: Secondary | ICD-10-CM

## 2011-12-16 DIAGNOSIS — I509 Heart failure, unspecified: Secondary | ICD-10-CM | POA: Insufficient documentation

## 2011-12-16 HISTORY — DX: Unspecified osteoarthritis, unspecified site: M19.90

## 2011-12-16 MED ORDER — OXYCODONE-ACETAMINOPHEN 5-325 MG PO TABS
1.0000 | ORAL_TABLET | Freq: Four times a day (QID) | ORAL | Status: DC | PRN
Start: 2011-12-16 — End: 2014-09-08

## 2011-12-16 MED ORDER — ONDANSETRON HCL 4 MG PO TABS
4.0000 mg | ORAL_TABLET | Freq: Once | ORAL | Status: AC
  Administered 2011-12-16: 4 mg via ORAL
  Filled 2011-12-16: qty 1

## 2011-12-16 MED ORDER — MELOXICAM 7.5 MG PO TABS: ORAL_TABLET | ORAL | Status: DC

## 2011-12-16 MED ORDER — KETOROLAC TROMETHAMINE 10 MG PO TABS
10.0000 mg | ORAL_TABLET | Freq: Once | ORAL | Status: AC
  Administered 2011-12-16: 10 mg via ORAL
  Filled 2011-12-16: qty 1

## 2011-12-16 MED ORDER — HYDROMORPHONE HCL PF 1 MG/ML IJ SOLN
1.0000 mg | Freq: Once | INTRAMUSCULAR | Status: AC
  Administered 2011-12-16: 1 mg via INTRAMUSCULAR
  Filled 2011-12-16: qty 1

## 2011-12-16 NOTE — ED Notes (Addendum)
Pt complains of right knee pain since last night, states she does not recall any injury. Pt tearful on assessment, states normal ROM but severe pain with ROM. States "ive been hearing stuff pop lately in my knee"

## 2011-12-16 NOTE — ED Notes (Signed)
Pt reports right knee pain since Friday, has chronic pain to knee, denies any known injury

## 2011-12-16 NOTE — ED Provider Notes (Signed)
History     CSN: [id number]  Arrival date & time 12/16/11  1658   First MD Initiated Contact with Patient 12/16/11 1934      Chief Complaint  Patient presents with  . Knee Pain    (Consider location/radiation/quality/duration/timing/severity/associated sxs/prior treatment) HPI Comments: Patient states she has chronic knee pain. The pain is usually controlled but she has run out of her medication and now has severe pain involving the right knee. There's been no fall or injury reported.  Patient is a 56 y.o. female presenting with knee pain. The history is provided by the patient.  Knee Pain This is a chronic problem. The problem occurs constantly. The problem has been gradually worsening. Associated symptoms include arthralgias. Pertinent negatives include no abdominal pain, chest pain, coughing or neck pain. The symptoms are aggravated by standing and walking. She has tried nothing for the symptoms. The treatment provided no relief.    Past Medical History  Diagnosis Date  . Hypertension   . CHF (congestive heart failure)   . Arthritis   . DJD (degenerative joint disease)     to knees    Past Surgical History  Procedure Date  . Cesarean section   . Hernia repair   . Cataract extraction   . Cholecystectomy     No family history on file.  History  Substance Use Topics  . Smoking status: Current Some Day Smoker    Types: Cigarettes  . Smokeless tobacco: Not on file  . Alcohol Use: Yes    OB History    Grav Para Term Preterm Abortions TAB SAB Ect Mult Living                  Review of Systems  Constitutional: Negative for activity change.       All ROS Neg except as noted in HPI  HENT: Negative for nosebleeds and neck pain.   Eyes: Negative for photophobia and discharge.  Respiratory: Negative for cough, shortness of breath and wheezing.   Cardiovascular: Negative for chest pain and palpitations.  Gastrointestinal: Negative for abdominal pain and blood in  stool.  Genitourinary: Negative for dysuria, frequency and hematuria.  Musculoskeletal: Positive for arthralgias. Negative for back pain.  Skin: Negative.   Neurological: Negative for dizziness, seizures and speech difficulty.  Psychiatric/Behavioral: Negative for hallucinations and confusion.    Allergies  Iohexol; Penicillins; and Nickel  Home Medications   Current Outpatient Rx  Name  Route  Sig  Dispense  Refill  . ALPRAZOLAM 1 MG PO TABS   Oral   Take 1 mg by mouth 3 (three) times daily.           Marland Kitchen AMLODIPINE BESYLATE 10 MG PO TABS   Oral   Take 10 mg by mouth every morning.          . ASPIRIN EC 81 MG PO TBEC   Oral   Take 81 mg by mouth at bedtime.           Marland Kitchen CLONIDINE HCL 0.2 MG PO TABS   Oral   Take 0.2 mg by mouth 4 (four) times daily.           . DULOXETINE HCL 60 MG PO CPEP   Oral   Take 60 mg by mouth daily.           . FUROSEMIDE 40 MG PO TABS   Oral   Take 40 mg by mouth daily as needed. For fluid retention         .  LISINOPRIL 40 MG PO TABS   Oral   Take 40 mg by mouth at bedtime.          Marland Kitchen METOPROLOL TARTRATE PO   Oral   Take 1 tablet by mouth 2 (two) times daily.         Marland Kitchen OMEPRAZOLE 20 MG PO CPDR   Oral   Take 40-60 mg by mouth at bedtime.          . OXYCODONE-ACETAMINOPHEN 10-325 MG PO TABS   Oral   Take 1 tablet by mouth every 4 (four) hours as needed. *Take one tablet every 4 to 6 hours as needed for pain*          . PHENYTOIN SODIUM EXTENDED 100 MG PO CAPS   Oral   Take 200-300 mg by mouth 2 (two) times daily. *Take two capsules every morning and three capsules at bedtime*          . POTASSIUM GLUCONATE 595 MG PO CAPS   Oral   Take 1 capsule by mouth every morning.          . TRAZODONE HCL 100 MG PO TABS   Oral   Take 200 mg by mouth at bedtime.           . MELOXICAM 7.5 MG PO TABS      1 po bid with food   12 tablet   0   . OXYCODONE-ACETAMINOPHEN 5-325 MG PO TABS   Oral   Take 1 tablet by  mouth every 6 (six) hours as needed for pain.   15 tablet   0     BP 118/71  Pulse 86  Temp 98.6 F (37 C) (Oral)  Resp 20  Wt 187 lb (84.823 kg)  SpO2 100%  Physical Exam  Nursing note and vitals reviewed. Constitutional: She is oriented to person, place, and time. She appears well-developed and well-nourished.  Non-toxic appearance.  HENT:  Head: Normocephalic.  Right Ear: Tympanic membrane and external ear normal.  Left Ear: Tympanic membrane and external ear normal.  Eyes: EOM and lids are normal. Pupils are equal, round, and reactive to light.  Neck: Normal range of motion. Neck supple. Carotid bruit is not present.  Cardiovascular: Normal rate, regular rhythm, normal heart sounds, intact distal pulses and normal pulses.   Pulmonary/Chest: Breath sounds normal. No respiratory distress.  Abdominal: Soft. Bowel sounds are normal. There is no tenderness. There is no guarding.  Musculoskeletal: Normal range of motion.       There are degenerative joint disease changes of the right knee. There is no effusion appreciated. There is no posterior mass appreciated. There is crepitus noted on examination. There is no deformity of the anterior tibial tuberosity. There is good range of motion of the right ankle and toes.  Lymphadenopathy:       Head (right side): No submandibular adenopathy present.       Head (left side): No submandibular adenopathy present.    She has no cervical adenopathy.  Neurological: She is alert and oriented to person, place, and time. She has normal strength. No cranial nerve deficit or sensory deficit.  Skin: Skin is warm and dry.  Psychiatric: She has a normal mood and affect. Her speech is normal.    ED Course  Procedures (including critical care time)  Labs Reviewed - No data to display Dg Knee Complete 4 Views Right  12/16/2011  *RADIOLOGY REPORT*  Clinical Data: Right knee pain.  No known injury.  RIGHT KNEE - COMPLETE 4+ VIEW  Comparison:   03/24/2009.  Findings:  There is no evidence of fracture, dislocation, or joint effusion.  There is no evidence of arthropathy or other focal bone abnormality.  Soft tissues are unremarkable.Improved appearance from priors.  IMPRESSION: Negative.   Original Report Authenticated By: Davonna Belling, M.D.   Pulse ox 100% on room air. Within normal limits by my interpretation.   1. Right knee DJD       MDM  I have reviewed nursing notes, vital signs, and all appropriate lab and imaging results for this patient. The x-ray of the right knee is negative for fracture or dislocation. The patient is placed in a knee immobilizer. She was treated with an injection of Dilaudid with some relief of her pain. Prescription for Mobic, Percocet #15 tablets, given to the patient. The patient is to followup with the orthopedic physician for additional evaluation.       Kathie Dike, [place] 12/16/11 2128

## 2011-12-17 NOTE — ED Provider Notes (Signed)
Medical screening examination/treatment/procedure(s) were performed by non-physician practitioner and as supervising physician I was immediately available for consultation/collaboration.  Aradhya Shellenbarger, MD 12/17/11 1138 

## 2014-09-08 ENCOUNTER — Encounter (HOSPITAL_COMMUNITY): Payer: Self-pay | Admitting: Emergency Medicine

## 2014-09-08 ENCOUNTER — Emergency Department (HOSPITAL_COMMUNITY)
Admission: EM | Admit: 2014-09-08 | Discharge: 2014-09-08 | Discharge status: Against Medical Advice | Payer: Medicare Other | Attending: Emergency Medicine | Admitting: Emergency Medicine

## 2014-09-08 DIAGNOSIS — I10 Essential (primary) hypertension: Secondary | ICD-10-CM | POA: Diagnosis not present

## 2014-09-08 DIAGNOSIS — M545 Low back pain: Secondary | ICD-10-CM | POA: Insufficient documentation

## 2014-09-08 DIAGNOSIS — Z79899 Other long term (current) drug therapy: Secondary | ICD-10-CM | POA: Insufficient documentation

## 2014-09-08 DIAGNOSIS — Z791 Long term (current) use of non-steroidal anti-inflammatories (NSAID): Secondary | ICD-10-CM | POA: Insufficient documentation

## 2014-09-08 DIAGNOSIS — G8929 Other chronic pain: Secondary | ICD-10-CM | POA: Diagnosis not present

## 2014-09-08 DIAGNOSIS — M1990 Unspecified osteoarthritis, unspecified site: Secondary | ICD-10-CM | POA: Diagnosis not present

## 2014-09-08 DIAGNOSIS — Z720 Tobacco use: Secondary | ICD-10-CM | POA: Insufficient documentation

## 2014-09-08 DIAGNOSIS — R569 Unspecified convulsions: Secondary | ICD-10-CM | POA: Diagnosis present

## 2014-09-08 DIAGNOSIS — Z7982 Long term (current) use of aspirin: Secondary | ICD-10-CM | POA: Insufficient documentation

## 2014-09-08 DIAGNOSIS — M25569 Pain in unspecified knee: Secondary | ICD-10-CM | POA: Diagnosis not present

## 2014-09-08 DIAGNOSIS — I509 Heart failure, unspecified: Secondary | ICD-10-CM | POA: Insufficient documentation

## 2014-09-08 DIAGNOSIS — Z880 Allergy status to penicillin: Secondary | ICD-10-CM | POA: Diagnosis not present

## 2014-09-08 HISTORY — DX: Other psychoactive substance abuse, uncomplicated: F19.10

## 2014-09-08 HISTORY — DX: Malingerer (conscious simulation): Z76.5

## 2014-09-08 HISTORY — DX: Dorsalgia, unspecified: M54.9

## 2014-09-08 HISTORY — DX: Other chronic pain: G89.29

## 2014-09-08 HISTORY — DX: Unspecified convulsions: R56.9

## 2014-09-08 LAB — BASIC METABOLIC PANEL
ANION GAP: 9 (ref 5–15)
BUN: 11 mg/dL (ref 6–20)
CALCIUM: 8.2 mg/dL — AB (ref 8.9–10.3)
CHLORIDE: 94 mmol/L — AB (ref 101–111)
CO2: 24 mmol/L (ref 22–32)
Creatinine, Ser: 0.75 mg/dL (ref 0.44–1.00)
GFR calc Af Amer: >60 mL/min (ref >60)
GFR calc non Af Amer: >60 mL/min (ref >60)
Glucose, Bld: 91 mg/dL (ref 65–99)
POTASSIUM: 4.5 mmol/L (ref 3.5–5.1)
SODIUM: 127 mmol/L — AB (ref 135–145)

## 2014-09-08 LAB — CBC WITH DIFFERENTIAL/PLATELET
Basophils Absolute: 0 10*3/uL (ref 0.0–0.1)
Basophils Relative: 0 % (ref 0–1)
Eosinophils Absolute: 0.2 10*3/uL (ref 0.0–0.7)
Eosinophils Relative: 1 % (ref 0–5)
HCT: 40.6 % (ref 36.0–46.0)
Hemoglobin: 13.9 g/dL (ref 12.0–15.0)
LYMPHS ABS: 4.2 10*3/uL — AB (ref 0.7–4.0)
Lymphocytes Relative: 29 % (ref 12–46)
MCH: 30.7 pg (ref 26.0–34.0)
MCHC: 34.2 g/dL (ref 30.0–36.0)
MCV: 89.6 fL (ref 78.0–100.0)
Monocytes Absolute: 0.7 10*3/uL (ref 0.1–1.0)
Monocytes Relative: 5 % (ref 3–12)
NEUTROS PCT: 65 % (ref 43–77)
Neutro Abs: 9.3 10*3/uL — ABNORMAL HIGH (ref 1.7–7.7)
Platelets: 348 10*3/uL (ref 150–400)
RBC: 4.53 MIL/uL (ref 3.87–5.11)
RDW: 12.9 % (ref 11.5–15.5)
WBC: 14.3 10*3/uL — AB (ref 4.0–10.5)

## 2014-09-08 LAB — PHENYTOIN LEVEL, TOTAL: Phenytoin Lvl: <2.5 ug/mL — ABNORMAL LOW (ref 10.0–20.0)

## 2014-09-08 LAB — MAGNESIUM: Magnesium: 1.7 mg/dL (ref 1.7–2.4)

## 2014-09-08 NOTE — ED Provider Notes (Signed)
CSN: [id number]     Arrival date & time 09/08/14  1311 History   First MD Initiated Contact with Patient 09/08/14 1409     Chief Complaint  Patient presents with  . Fall  . Seizures     (Consider location/radiation/quality/duration/timing/severity/associated sxs/prior Treatment) Patient is a 59 y.o. female presenting with fall and seizures. The history is provided by the patient and the EMS personnel.  Fall Pertinent negatives include no chest pain, no abdominal pain, no headaches and no shortness of breath.  Seizures  patient with a history of seizures. Patient brought in by EMS. Patient states that she feels like she had a seizure fell in a ditch. EMS was called. Patient's blood sugar upon EMS arrival was 94. Patient was complaining of knee pain and back pain. Patient states she takes Dilantin for her seizures. Patient has a past history significant for substance abuse and drug-seeking behavior.  Past Medical History  Diagnosis Date  . Hypertension   . CHF (congestive heart failure)   . Arthritis   . DJD (degenerative joint disease)     to knees  . Seizures   . Polysubstance abuse 2007  . Drug-seeking behavior 2007  . Chronic back pain    Past Surgical History  Procedure Laterality Date  . Cesarean section    . Hernia repair    . Cataract extraction    . Cholecystectomy     No family history on file. History  Substance Use Topics  . Smoking status: Current Some Day Smoker    Types: Cigarettes  . Smokeless tobacco: Not on file  . Alcohol Use: Yes   OB History    No data available     Review of Systems  Constitutional: Negative for fever.  HENT: Negative for congestion.   Eyes: Negative for visual disturbance.  Respiratory: Negative for shortness of breath.   Cardiovascular: Negative for chest pain.  Gastrointestinal: Negative for abdominal pain.  Musculoskeletal: Positive for back pain.  Skin: Positive for wound.  Neurological: Positive for seizures. Negative  for headaches.  Hematological: Does not bruise/bleed easily.  Psychiatric/Behavioral: Negative for confusion.      Allergies  Iohexol; Penicillins; and Nickel  Home Medications   Prior to Admission medications   Medication Sig Start Date End Date Taking? Authorizing Provider  ALPRAZolam Duanne Moron) 1 MG tablet Take 1 mg by mouth 3 (three) times daily.     Yes Historical Provider, MD  amLODipine (NORVASC) 10 MG tablet Take 10 mg by mouth every morning.    Yes Historical Provider, MD  ANORO ELLIPTA 62.5-25 MCG/INH AEPB Inhale 1 puff into the lungs daily. 08/19/14  Yes Historical Provider, MD  aspirin EC 81 MG tablet Take 81 mg by mouth at bedtime.     Yes Historical Provider, MD  cloNIDine (CATAPRES) 0.2 MG tablet Take 0.2 mg by mouth 4 (four) times daily.     Yes Historical Provider, MD  DULoxetine (CYMBALTA) 60 MG capsule Take 60 mg by mouth daily.     Yes Historical Provider, MD  esomeprazole (NEXIUM) 40 MG capsule Take 40 mg by mouth at bedtime.   Yes Historical Provider, MD  hydrOXYzine (ATARAX/VISTARIL) 25 MG tablet Take 1 tablet by mouth 4 (four) times daily. 08/19/14  Yes Historical Provider, MD  lisinopril (PRINIVIL,ZESTRIL) 40 MG tablet Take 40 mg by mouth at bedtime.    Yes Historical Provider, MD  meloxicam (MOBIC) 7.5 MG tablet 1 po bid with food Patient taking differently: Take 7.5 mg by mouth  daily. 1 po bid with food 12/16/11  Yes Lily Kocher, PA-C  metoprolol (LOPRESSOR) 50 MG tablet Take 1 tablet by mouth 2 (two) times daily. 08/19/14  Yes Historical Provider, MD  oxyCODONE-acetaminophen (PERCOCET) 10-325 MG per tablet Take 1 tablet by mouth every 4 (four) hours as needed. *Take one tablet every 4 to 6 hours as needed for pain*    Yes Historical Provider, MD  Potassium Gluconate 595 MG CAPS Take 1 capsule by mouth every morning.    Yes Historical Provider, MD  traZODone (DESYREL) 100 MG tablet Take 200 mg by mouth at bedtime.     Yes Historical Provider, MD  VENTOLIN HFA 108  (90 BASE) MCG/ACT inhaler Inhale 2 puffs into the lungs 2 (two) times daily. 07/21/14  Yes Historical Provider, MD   BP 100/60 mmHg  Pulse 68  Temp(Src) 98 F (36.7 C) (Oral)  Resp 18  SpO2 100% Physical Exam  Constitutional: She is oriented to person, place, and time. She appears well-developed and well-nourished. No distress.  HENT:  Head: Normocephalic and atraumatic.  Mouth/Throat: Oropharynx is clear and moist.  Eyes: Conjunctivae and EOM are normal. Pupils are equal, round, and reactive to light.  Neck: Normal range of motion.  Cardiovascular: Normal rate, regular rhythm and normal heart sounds.   Pulmonary/Chest: Effort normal and breath sounds normal. No respiratory distress.  Abdominal: Soft. Bowel sounds are normal. There is no tenderness.  Neurological: She is alert and oriented to person, place, and time. No cranial nerve deficit. She exhibits normal muscle tone. Coordination normal.  Nursing note and vitals reviewed.   ED Course  Procedures (including critical care time) Labs Review Labs Reviewed  CBC WITH DIFFERENTIAL/PLATELET - Abnormal; Notable for the following:    WBC 14.3 (*)    Neutro Abs 9.3 (*)    Lymphs Abs 4.2 (*)    All other components within normal limits  BASIC METABOLIC PANEL  MAGNESIUM  PHENYTOIN LEVEL, TOTAL   Results for orders placed or performed during the hospital encounter of 09/08/14  CBC with Differential/Platelet  Result Value Ref Range   WBC 14.3 (H) 4.0 - 10.5 K/uL   RBC 4.53 3.87 - 5.11 MIL/uL   Hemoglobin 13.9 12.0 - 15.0 g/dL   HCT 40.6 36.0 - 46.0 %   MCV 89.6 78.0 - 100.0 fL   MCH 30.7 26.0 - 34.0 pg   MCHC 34.2 30.0 - 36.0 g/dL   RDW 12.9 11.5 - 15.5 %   Platelets 348 150 - 400 K/uL   Neutrophils Relative % 65 43 - 77 %   Neutro Abs 9.3 (H) 1.7 - 7.7 K/uL   Lymphocytes Relative 29 12 - 46 %   Lymphs Abs 4.2 (H) 0.7 - 4.0 K/uL   Monocytes Relative 5 3 - 12 %   Monocytes Absolute 0.7 0.1 - 1.0 K/uL   Eosinophils Relative  1 0 - 5 %   Eosinophils Absolute 0.2 0.0 - 0.7 K/uL   Basophils Relative 0 0 - 1 %   Basophils Absolute 0.0 0.0 - 0.1 K/uL    Imaging Review No results found.   EKG Interpretation None      MDM   Final diagnoses:  Seizure    Patient ended up leaving AMA prior to completion of evaluation. According to patient she has a history of seizures EMS was called by a neighbor could she fell in ditch patient stated she did feel like she had a seizure. Complaining of knee pain and back  pain. Patient reports being on Dilantin for the seizures. Labs were ordered for seizure workup. X-rays had not been ordered jet. Patient left prior to being able to do that.  Patient did have some abrasions on extremities.   Patient ended up telling nurse that if she wasn't going to get strong pain medicines that she was given just go home and she left AMA.    Fredia Sorrow, MD 09/08/14 [phone]

## 2014-09-08 NOTE — ED Notes (Signed)
Pt states that she wants to go home at this time.  States that she wants to go home and take a nap. Advised that she should continue her evaluation as it is unknown at this time what happened.  Pt states "I know I had a seizure and it happens all the time.  I just need pain meds and a nap".  Pt left ambulatory in no distress.

## 2014-09-08 NOTE — ED Notes (Addendum)
Fell in ditch, neighbor called EMS,  hx seizure, pt feeling like she did have a seizure, complaining of knee pain, back pain.  BS 94

## 2015-10-17 DIAGNOSIS — G894 Chronic pain syndrome: Secondary | ICD-10-CM | POA: Diagnosis not present

## 2015-10-17 DIAGNOSIS — M1711 Unilateral primary osteoarthritis, right knee: Secondary | ICD-10-CM | POA: Diagnosis not present

## 2015-10-17 DIAGNOSIS — Z720 Tobacco use: Secondary | ICD-10-CM | POA: Diagnosis not present

## 2015-10-17 DIAGNOSIS — J449 Chronic obstructive pulmonary disease, unspecified: Secondary | ICD-10-CM | POA: Diagnosis not present

## 2015-11-20 DIAGNOSIS — Z79891 Long term (current) use of opiate analgesic: Secondary | ICD-10-CM | POA: Diagnosis not present

## 2015-11-20 DIAGNOSIS — Z79899 Other long term (current) drug therapy: Secondary | ICD-10-CM | POA: Diagnosis not present

## 2015-11-20 DIAGNOSIS — M545 Low back pain: Secondary | ICD-10-CM | POA: Diagnosis not present

## 2015-11-20 DIAGNOSIS — M25569 Pain in unspecified knee: Secondary | ICD-10-CM | POA: Diagnosis not present

## 2015-11-20 DIAGNOSIS — M1288 Other specific arthropathies, not elsewhere classified, other specified site: Secondary | ICD-10-CM | POA: Diagnosis not present

## 2015-11-20 DIAGNOSIS — G894 Chronic pain syndrome: Secondary | ICD-10-CM | POA: Diagnosis not present

## 2015-12-05 DIAGNOSIS — Z720 Tobacco use: Secondary | ICD-10-CM | POA: Diagnosis not present

## 2015-12-05 DIAGNOSIS — Z8701 Personal history of pneumonia (recurrent): Secondary | ICD-10-CM | POA: Diagnosis not present

## 2015-12-05 DIAGNOSIS — G8929 Other chronic pain: Secondary | ICD-10-CM | POA: Diagnosis present

## 2015-12-05 DIAGNOSIS — Z91041 Radiographic dye allergy status: Secondary | ICD-10-CM | POA: Diagnosis not present

## 2015-12-05 DIAGNOSIS — K219 Gastro-esophageal reflux disease without esophagitis: Secondary | ICD-10-CM | POA: Diagnosis present

## 2015-12-05 DIAGNOSIS — J189 Pneumonia, unspecified organism: Secondary | ICD-10-CM | POA: Diagnosis not present

## 2015-12-05 DIAGNOSIS — I110 Hypertensive heart disease with heart failure: Secondary | ICD-10-CM | POA: Diagnosis present

## 2015-12-05 DIAGNOSIS — Z7982 Long term (current) use of aspirin: Secondary | ICD-10-CM | POA: Diagnosis not present

## 2015-12-05 DIAGNOSIS — R0682 Tachypnea, not elsewhere classified: Secondary | ICD-10-CM | POA: Diagnosis not present

## 2015-12-05 DIAGNOSIS — F410 Panic disorder [episodic paroxysmal anxiety] without agoraphobia: Secondary | ICD-10-CM | POA: Diagnosis present

## 2015-12-05 DIAGNOSIS — Z880 Allergy status to penicillin: Secondary | ICD-10-CM | POA: Diagnosis not present

## 2015-12-05 DIAGNOSIS — I509 Heart failure, unspecified: Secondary | ICD-10-CM | POA: Diagnosis present

## 2015-12-05 DIAGNOSIS — Z8541 Personal history of malignant neoplasm of cervix uteri: Secondary | ICD-10-CM | POA: Diagnosis not present

## 2015-12-05 DIAGNOSIS — Z79891 Long term (current) use of opiate analgesic: Secondary | ICD-10-CM | POA: Diagnosis not present

## 2015-12-05 DIAGNOSIS — Z91048 Other nonmedicinal substance allergy status: Secondary | ICD-10-CM | POA: Diagnosis not present

## 2015-12-05 DIAGNOSIS — M1990 Unspecified osteoarthritis, unspecified site: Secondary | ICD-10-CM | POA: Diagnosis present

## 2015-12-05 DIAGNOSIS — G4730 Sleep apnea, unspecified: Secondary | ICD-10-CM | POA: Diagnosis present

## 2015-12-05 DIAGNOSIS — M549 Dorsalgia, unspecified: Secondary | ICD-10-CM | POA: Diagnosis present

## 2015-12-05 DIAGNOSIS — Z79899 Other long term (current) drug therapy: Secondary | ICD-10-CM | POA: Diagnosis not present

## 2015-12-05 DIAGNOSIS — F17200 Nicotine dependence, unspecified, uncomplicated: Secondary | ICD-10-CM | POA: Diagnosis present

## 2015-12-05 DIAGNOSIS — R0602 Shortness of breath: Secondary | ICD-10-CM | POA: Diagnosis not present

## 2015-12-05 DIAGNOSIS — J441 Chronic obstructive pulmonary disease with (acute) exacerbation: Secondary | ICD-10-CM | POA: Diagnosis present

## 2015-12-05 DIAGNOSIS — R0902 Hypoxemia: Secondary | ICD-10-CM | POA: Diagnosis present

## 2015-12-22 DIAGNOSIS — I10 Essential (primary) hypertension: Secondary | ICD-10-CM | POA: Diagnosis not present

## 2015-12-22 DIAGNOSIS — M5136 Other intervertebral disc degeneration, lumbar region: Secondary | ICD-10-CM | POA: Diagnosis not present

## 2015-12-22 DIAGNOSIS — J449 Chronic obstructive pulmonary disease, unspecified: Secondary | ICD-10-CM | POA: Diagnosis not present

## 2015-12-22 DIAGNOSIS — I509 Heart failure, unspecified: Secondary | ICD-10-CM | POA: Diagnosis not present

## 2015-12-22 DIAGNOSIS — I110 Hypertensive heart disease with heart failure: Secondary | ICD-10-CM | POA: Diagnosis not present

## 2016-01-09 ENCOUNTER — Emergency Department (HOSPITAL_COMMUNITY)
Admission: EM | Admit: 2016-01-09 | Discharge: 2016-01-10 | Disposition: A | Discharge status: Home / Self Care | Payer: Medicare Other | Attending: Emergency Medicine | Admitting: Emergency Medicine

## 2016-01-09 ENCOUNTER — Emergency Department (HOSPITAL_COMMUNITY): Payer: Medicare Other

## 2016-01-09 DIAGNOSIS — Z7982 Long term (current) use of aspirin: Secondary | ICD-10-CM | POA: Insufficient documentation

## 2016-01-09 DIAGNOSIS — F17210 Nicotine dependence, cigarettes, uncomplicated: Secondary | ICD-10-CM | POA: Diagnosis not present

## 2016-01-09 DIAGNOSIS — Y9301 Activity, walking, marching and hiking: Secondary | ICD-10-CM | POA: Diagnosis not present

## 2016-01-09 DIAGNOSIS — J449 Chronic obstructive pulmonary disease, unspecified: Secondary | ICD-10-CM | POA: Diagnosis not present

## 2016-01-09 DIAGNOSIS — S80212A Abrasion, left knee, initial encounter: Secondary | ICD-10-CM | POA: Insufficient documentation

## 2016-01-09 DIAGNOSIS — G8929 Other chronic pain: Secondary | ICD-10-CM | POA: Diagnosis not present

## 2016-01-09 DIAGNOSIS — I509 Heart failure, unspecified: Secondary | ICD-10-CM | POA: Insufficient documentation

## 2016-01-09 DIAGNOSIS — M25561 Pain in right knee: Secondary | ICD-10-CM

## 2016-01-09 DIAGNOSIS — M25562 Pain in left knee: Secondary | ICD-10-CM

## 2016-01-09 DIAGNOSIS — W1839XA Other fall on same level, initial encounter: Secondary | ICD-10-CM | POA: Diagnosis not present

## 2016-01-09 DIAGNOSIS — R52 Pain, unspecified: Secondary | ICD-10-CM | POA: Diagnosis not present

## 2016-01-09 DIAGNOSIS — Y92009 Unspecified place in unspecified non-institutional (private) residence as the place of occurrence of the external cause: Secondary | ICD-10-CM | POA: Diagnosis not present

## 2016-01-09 DIAGNOSIS — M25532 Pain in left wrist: Secondary | ICD-10-CM | POA: Diagnosis not present

## 2016-01-09 DIAGNOSIS — M25569 Pain in unspecified knee: Secondary | ICD-10-CM | POA: Diagnosis not present

## 2016-01-09 DIAGNOSIS — W19XXXA Unspecified fall, initial encounter: Secondary | ICD-10-CM

## 2016-01-09 DIAGNOSIS — S9031XA Contusion of right foot, initial encounter: Secondary | ICD-10-CM | POA: Insufficient documentation

## 2016-01-09 DIAGNOSIS — S99921A Unspecified injury of right foot, initial encounter: Secondary | ICD-10-CM | POA: Diagnosis present

## 2016-01-09 DIAGNOSIS — Z79899 Other long term (current) drug therapy: Secondary | ICD-10-CM | POA: Insufficient documentation

## 2016-01-09 DIAGNOSIS — I110 Hypertensive heart disease with heart failure: Secondary | ICD-10-CM | POA: Insufficient documentation

## 2016-01-09 DIAGNOSIS — S80211A Abrasion, right knee, initial encounter: Secondary | ICD-10-CM | POA: Insufficient documentation

## 2016-01-09 DIAGNOSIS — Y999 Unspecified external cause status: Secondary | ICD-10-CM | POA: Insufficient documentation

## 2016-01-09 DIAGNOSIS — R4182 Altered mental status, unspecified: Secondary | ICD-10-CM | POA: Insufficient documentation

## 2016-01-09 HISTORY — DX: Chronic obstructive pulmonary disease, unspecified: J44.9

## 2016-01-09 NOTE — ED Triage Notes (Addendum)
Pt states she was on the toilet earlier this morning. Pt states she feels like her feet bent backwards and she fell. Pt c/o bilateral feet, ankle and knee pain. Pt reports she has been told she needs a knee replacement on her right knee. Pt seems half asleep in triage.  Pt has scrapes on her knees. Pt now c/o left wrist pain. Pt seems impaired in triage. Pt states she doesn't have narcolepsy and she has not taken her percocets today. Pt now states she has taken her xanax and that is why she is sleepy and slow to answer questions.

## 2016-01-10 ENCOUNTER — Emergency Department (HOSPITAL_COMMUNITY): Payer: Medicare Other

## 2016-01-10 ENCOUNTER — Encounter (HOSPITAL_COMMUNITY): Payer: Self-pay | Admitting: Emergency Medicine

## 2016-01-10 DIAGNOSIS — S9031XA Contusion of right foot, initial encounter: Secondary | ICD-10-CM | POA: Diagnosis not present

## 2016-01-10 DIAGNOSIS — R279 Unspecified lack of coordination: Secondary | ICD-10-CM | POA: Diagnosis not present

## 2016-01-10 DIAGNOSIS — Z743 Need for continuous supervision: Secondary | ICD-10-CM | POA: Diagnosis not present

## 2016-01-10 DIAGNOSIS — R4182 Altered mental status, unspecified: Secondary | ICD-10-CM | POA: Diagnosis not present

## 2016-01-10 MED ORDER — IBUPROFEN 400 MG PO TABS
600.0000 mg | ORAL_TABLET | Freq: Once | ORAL | Status: AC
  Administered 2016-01-10: 600 mg via ORAL
  Filled 2016-01-10: qty 2

## 2016-01-10 NOTE — Discharge Instructions (Signed)
Elevate your foot, use ice for comfort. You can have your foot rechecked by Dr Aline Brochure, who has evaluated your knees before. You can take ibprofen for pain as needed. If you need something stronger, you will need to see your pain management doctor.

## 2016-01-10 NOTE — ED Provider Notes (Signed)
[facility] DEPT Provider Note   CSN: [id number] Arrival date & time: 01/09/16  2215  By signing my name below, I, Dora Sims, attest that this documentation has been prepared under the direction and in the presence of physician practitioner, Rolland Porter, MD. Electronically Signed: Dora Sims, Scribe. 01/10/2016. 12:33 AM.  TIme seen 12:30 AM  History   Chief Complaint Chief Complaint  Patient presents with  . Fall    The history is provided by the patient. No language interpreter was used.     HPI Comments: [name] is a 60 y.o. female who presents to the Emergency Department complaining of sudden onset, constant, severe, right foot pain s/p falling yesterday morning, December 5 around 8 AM. She notes associated bruising as well as some left wrist pain. She states she was walking to the bathroom when her right foot bent backwards and caused her to fall. She tried to rest throughout the day yesterday but had no improvement of her right foot pain. She endorses pain exacerbation with palpation to her right foot. She states one pack of cigarettes lasts about 2 days. Pt endorses alcohol use and states she drinks a couple of beers occasionally. She is on disability (due to DDD). She reports chronic left foot pain but denies acute change from the fall. Pt states she needs a right knee replacement and has had consultations with Dr. Aline Brochure about the operation. She denies numbness, wounds, syncope, or any other associated symptoms.   PCP: Dr. Wenda Overland She is followed by [facility] for her chronic pain.  Past Medical History:  Diagnosis Date  . Arthritis   . CHF (congestive heart failure) ([place])   . Chronic back pain   . COPD (chronic obstructive pulmonary disease) ([place])   . DJD (degenerative joint disease)    to knees  . Drug-seeking behavior 2007  . Hypertension   . Polysubstance abuse 2007  . Seizures ([place])     There are no active problems to display for  this patient.   Past Surgical History:  Procedure Laterality Date  . CATARACT EXTRACTION    . CESAREAN SECTION    . CHOLECYSTECTOMY    . HERNIA REPAIR      OB History    No data available       Home Medications    Prior to Admission medications   Medication Sig Start Date End Date Taking? Authorizing Provider  ALPRAZolam Duanne Moron) 1 MG tablet Take 1 mg by mouth 3 (three) times daily.      Historical Provider, MD  amLODipine (NORVASC) 10 MG tablet Take 10 mg by mouth every morning.     Historical Provider, MD  ANORO ELLIPTA 62.5-25 MCG/INH AEPB Inhale 1 puff into the lungs daily. 08/19/14   Historical Provider, MD  aspirin EC 81 MG tablet Take 81 mg by mouth at bedtime.      Historical Provider, MD  cloNIDine (CATAPRES) 0.2 MG tablet Take 0.2 mg by mouth 4 (four) times daily.      Historical Provider, MD  DULoxetine (CYMBALTA) 60 MG capsule Take 60 mg by mouth daily.      Historical Provider, MD  esomeprazole (NEXIUM) 40 MG capsule Take 40 mg by mouth at bedtime.    Historical Provider, MD  hydrOXYzine (ATARAX/VISTARIL) 25 MG tablet Take 1 tablet by mouth 4 (four) times daily. 08/19/14   Historical Provider, MD  lisinopril (PRINIVIL,ZESTRIL) 40 MG tablet Take 40 mg by mouth at bedtime.     Historical  Provider, MD  meloxicam (MOBIC) 7.5 MG tablet 1 po bid with food Patient taking differently: Take 7.5 mg by mouth daily. 1 po bid with food 12/16/11   Lily Kocher, PA-C  metoprolol (LOPRESSOR) 50 MG tablet Take 1 tablet by mouth 2 (two) times daily. 08/19/14   Historical Provider, MD  oxyCODONE-acetaminophen (PERCOCET) 10-325 MG per tablet Take 1 tablet by mouth every 4 (four) hours as needed. *Take one tablet every 4 to 6 hours as needed for pain*     Historical Provider, MD  Potassium Gluconate 595 MG CAPS Take 1 capsule by mouth every morning.     Historical Provider, MD  traZODone (DESYREL) 100 MG tablet Take 200 mg by mouth at bedtime.      Historical Provider, MD  VENTOLIN HFA 108  (90 BASE) MCG/ACT inhaler Inhale 2 puffs into the lungs 2 (two) times daily. 07/21/14   Historical Provider, MD    Family History No family history on file.  Social History Social History  Substance Use Topics  . Smoking status: Current Some Day Smoker    Types: Cigarettes  . Smokeless tobacco: Not on file  . Alcohol use No  drinks a few beers on the weekend On disability for her back Smokes 1/2 ppd   Allergies   Iohexol; Penicillins; and Nickel   Review of Systems Review of Systems  Musculoskeletal: Positive for arthralgias (left wrist pain) and myalgias (right foot).  Skin: Positive for color change (bruising to right foot). Negative for wound.  Neurological: Negative for syncope and numbness.  All other systems reviewed and are negative.    Physical Exam Updated Vital Signs BP 102/78 (BP Location: Left Arm)   Pulse 70   Temp 98 F (36.7 C) (Oral)   Ht 5\' 6"  (1.676 m)   Wt 220 lb (99.8 kg)   SpO2 98%   BMI 35.51 kg/m   Physical Exam  Constitutional: She is oriented to person, place, and time. She appears well-developed and well-nourished.  Non-toxic appearance. She does not appear ill. No distress.  HENT:  Head: Normocephalic and atraumatic.  Right Ear: External ear normal.  Left Ear: External ear normal.  Nose: Nose normal. No mucosal edema or rhinorrhea.  Mouth/Throat: Oropharynx is clear and moist and mucous membranes are normal. No dental abscesses or uvula swelling.  Eyes: Conjunctivae and EOM are normal. Pupils are equal, round, and reactive to light.  Neck: Normal range of motion and full passive range of motion without pain. Neck supple.  Cardiovascular: Normal rate, regular rhythm and normal heart sounds.  Exam reveals no gallop and no friction rub.   No murmur heard. Pulmonary/Chest: [place] normal and breath sounds normal. No respiratory distress. She has no wheezes. She has no rhonchi. She has no rales. She exhibits no tenderness and no crepitus.    Abdominal: Soft. Normal appearance and bowel sounds are normal. She exhibits no distension. There is no tenderness. There is no rebound and no guarding.  Musculoskeletal: Normal range of motion. She exhibits no edema or tenderness.  Moves all extremities well. No pain with flexion of her knees.  Small abrasion on her left knee, smaller abrasion on right knee. Area of bruising and swelling on the dorsum of her right foot with tenderness diffusely. No deformity of her left wrist, no swelling.   Neurological: She is alert and oriented to person, place, and time. She has normal strength. No cranial nerve deficit.  Skin: Skin is warm, dry and intact. No rash noted.  No erythema. No pallor.  Psychiatric: Her affect is blunt. Her speech is delayed. She is slowed.  Nursing note and vitals reviewed.    ED Treatments / Results    Dg Wrist Complete Left  Result Date: 01/09/2016 CLINICAL DATA:  Patient fell this morning with left wrist pain. EXAM: LEFT WRIST - COMPLETE 3+ VIEW COMPARISON:  Report from 05/18/2013, left thumb radiographs from 2015. FINDINGS: There is a healed fracture deformity distal radius with slight ulnar positive variance as a result. There is slight widening of the scapholunate interval up to 3 mm which is chronic and stable relative to the 2015 radiographs of the thumb. Two well corticated ossifications are seen adjacent to the scaphoid likely related to old remote trauma with donor site off the radial cortex of the scaphoid. These appear new since 2015. There is joint space narrowing base of the thumb metacarpal and triscaphe articulations consistent with osteoarthritis. No acute fracture is noted. IMPRESSION: Old posttraumatic deformity of the distal radius. No acute osseous abnormality. Slight widened appearance of the scapholunate interval may reflect a scapholunate ligament tear. Osteoarthritis of the first [facility] and triscaphe joints. Small periarticular ossifications adjacent to the  mid scaphoid along the radial aspect likely related to old remote with donor site off the scaphoid. These appear new since 2015. Electronically Signed   By: Ashley Royalty M.D.   On: 01/09/2016 23:35   Dg Foot Complete Right  Result Date: 01/09/2016 CLINICAL DATA:  Patient fell at home complaining of bruising of the metatarsals. EXAM: RIGHT FOOT COMPLETE - 3+ VIEW COMPARISON:  09/21/2006 report FINDINGS: Plantar and dorsal enthesophytes are noted off the calcaneus as previously described. Dorsal spurring is seen across the tail O navicular articulation with small subcortical cyst noted of the navicular. Slight spurring at the base of the metatarsals. Mild soft tissue swelling of the dorsal midfoot and lateral ankle. No acute fracture is identified. IMPRESSION: Calcaneal enthesophytes. Osteoarthritis at the base of the metatarsals and across the talonavicular joint. No acute osseous abnormality. Electronically Signed   By: Ashley Royalty M.D.   On: 01/09/2016 23:38     Ct Head Wo Contrast  Result Date: 01/10/2016 CLINICAL DATA:  Altered mental status.  Fell yesterday. EXAM: CT HEAD WITHOUT CONTRAST TECHNIQUE: Contiguous axial images were obtained from the base of the skull through the vertex without intravenous contrast. COMPARISON:  11/03/2005. FINDINGS: Brain: No evidence of acute infarction, hemorrhage, hydrocephalus, extra-axial collection or mass lesion/mass effect. Focal encephalomalacia consistent with remote infarction, posterior right parietal region. This was present by report on 01/14/2012. Gray matter and white matter are otherwise unremarkable, with normal differentiation. Vascular: No hyperdense vessel or unexpected calcification. Skull: Normal. Negative for fracture or focal lesion. Sinuses/Orbits: No acute finding. Other: None. IMPRESSION: Remote posterior right parietal infarction. No acute intracranial findings. Electronically Signed   By: Andreas Newport M.D.   On: 01/10/2016 06:20     Procedures Procedures (including critical care time)  Medications Ordered in ED Medications  ibuprofen (ADVIL,MOTRIN) tablet 600 mg (600 mg Oral Given 01/10/16 0521)     Initial Impression / Assessment and Plan / ED Course  I have reviewed the triage vital signs and the nursing notes.  Pertinent labs & imaging results that were available during my care of the patient were reviewed by me and considered in my medical decision making (see chart for details).  Clinical Course    DIAGNOSTIC STUDIES: Oxygen Saturation is 98% on RA, normal by my interpretation.  COORDINATION OF CARE: 12:41 AM Discussed treatment plan with pt at bedside and pt agreed to plan. Due to patient's somnolence and she appears to be in the influence of something she was observed in the emergency department for a [place] period of time. Finally did do a CT scan to make sure she did not hit her head or have some type intracranial injury and it was negative.  Patient may be getting her prescriptions by mail order because she states she's taking benzodiazepines and there were none on the database. She can take ibuprofen for pain as needed.   Review of the [place] shows patient has 1 prescription in the past 6 months for #120 oxycodone 7.5 mg/325 filled on October 16  Final Clinical Impressions(s) / ED Diagnoses   Final diagnoses:  Fall at home, initial encounter  Contusion of right foot, initial encounter  Chronic pain of both knees   Plan discharge  Rolland Porter, MD, Barbette Or, MD 01/10/16 [phone]

## 2016-04-01 DIAGNOSIS — Z7982 Long term (current) use of aspirin: Secondary | ICD-10-CM | POA: Diagnosis not present

## 2016-04-01 DIAGNOSIS — I10 Essential (primary) hypertension: Secondary | ICD-10-CM | POA: Diagnosis present

## 2016-04-01 DIAGNOSIS — R112 Nausea with vomiting, unspecified: Secondary | ICD-10-CM | POA: Diagnosis not present

## 2016-04-01 DIAGNOSIS — J441 Chronic obstructive pulmonary disease with (acute) exacerbation: Secondary | ICD-10-CM | POA: Diagnosis present

## 2016-04-01 DIAGNOSIS — I700 Atherosclerosis of aorta: Secondary | ICD-10-CM | POA: Diagnosis not present

## 2016-04-01 DIAGNOSIS — D7289 Other specified disorders of white blood cells: Secondary | ICD-10-CM | POA: Diagnosis not present

## 2016-04-01 DIAGNOSIS — Z8541 Personal history of malignant neoplasm of cervix uteri: Secondary | ICD-10-CM | POA: Diagnosis not present

## 2016-04-01 DIAGNOSIS — Z91048 Other nonmedicinal substance allergy status: Secondary | ICD-10-CM | POA: Diagnosis not present

## 2016-04-01 DIAGNOSIS — R531 Weakness: Secondary | ICD-10-CM | POA: Diagnosis not present

## 2016-04-01 DIAGNOSIS — G40909 Epilepsy, unspecified, not intractable, without status epilepticus: Secondary | ICD-10-CM | POA: Diagnosis not present

## 2016-04-01 DIAGNOSIS — R404 Transient alteration of awareness: Secondary | ICD-10-CM | POA: Diagnosis not present

## 2016-04-01 DIAGNOSIS — Z888 Allergy status to other drugs, medicaments and biological substances status: Secondary | ICD-10-CM | POA: Diagnosis not present

## 2016-04-01 DIAGNOSIS — N10 Acute tubulo-interstitial nephritis: Secondary | ICD-10-CM | POA: Diagnosis not present

## 2016-04-01 DIAGNOSIS — I509 Heart failure, unspecified: Secondary | ICD-10-CM | POA: Diagnosis present

## 2016-04-01 DIAGNOSIS — K219 Gastro-esophageal reflux disease without esophagitis: Secondary | ICD-10-CM | POA: Diagnosis present

## 2016-04-01 DIAGNOSIS — G8929 Other chronic pain: Secondary | ICD-10-CM | POA: Diagnosis present

## 2016-04-01 DIAGNOSIS — M549 Dorsalgia, unspecified: Secondary | ICD-10-CM | POA: Diagnosis present

## 2016-04-01 DIAGNOSIS — F17200 Nicotine dependence, unspecified, uncomplicated: Secondary | ICD-10-CM | POA: Diagnosis present

## 2016-04-01 DIAGNOSIS — E669 Obesity, unspecified: Secondary | ICD-10-CM | POA: Diagnosis present

## 2016-04-01 DIAGNOSIS — N12 Tubulo-interstitial nephritis, not specified as acute or chronic: Secondary | ICD-10-CM | POA: Diagnosis not present

## 2016-04-01 DIAGNOSIS — R569 Unspecified convulsions: Secondary | ICD-10-CM | POA: Diagnosis not present

## 2016-04-01 DIAGNOSIS — D72829 Elevated white blood cell count, unspecified: Secondary | ICD-10-CM | POA: Diagnosis present

## 2016-04-01 DIAGNOSIS — R52 Pain, unspecified: Secondary | ICD-10-CM | POA: Diagnosis not present

## 2016-04-01 DIAGNOSIS — Z91041 Radiographic dye allergy status: Secondary | ICD-10-CM | POA: Diagnosis not present

## 2016-04-01 DIAGNOSIS — E876 Hypokalemia: Secondary | ICD-10-CM | POA: Diagnosis not present

## 2016-04-01 DIAGNOSIS — Z6834 Body mass index (BMI) 34.0-34.9, adult: Secondary | ICD-10-CM | POA: Diagnosis not present

## 2016-04-01 DIAGNOSIS — Z880 Allergy status to penicillin: Secondary | ICD-10-CM | POA: Diagnosis not present

## 2016-04-11 DIAGNOSIS — R531 Weakness: Secondary | ICD-10-CM | POA: Diagnosis not present

## 2016-04-11 DIAGNOSIS — J441 Chronic obstructive pulmonary disease with (acute) exacerbation: Secondary | ICD-10-CM | POA: Diagnosis not present

## 2016-04-11 DIAGNOSIS — S0990XA Unspecified injury of head, initial encounter: Secondary | ICD-10-CM | POA: Diagnosis not present

## 2016-04-11 DIAGNOSIS — R404 Transient alteration of awareness: Secondary | ICD-10-CM | POA: Diagnosis not present

## 2016-04-11 DIAGNOSIS — I10 Essential (primary) hypertension: Secondary | ICD-10-CM | POA: Diagnosis not present

## 2016-04-11 DIAGNOSIS — R4182 Altered mental status, unspecified: Secondary | ICD-10-CM | POA: Diagnosis not present

## 2016-04-11 DIAGNOSIS — R061 Stridor: Secondary | ICD-10-CM | POA: Diagnosis not present

## 2016-04-11 DIAGNOSIS — R0602 Shortness of breath: Secondary | ICD-10-CM | POA: Diagnosis not present

## 2016-04-12 DIAGNOSIS — I10 Essential (primary) hypertension: Secondary | ICD-10-CM | POA: Diagnosis not present

## 2016-04-12 DIAGNOSIS — R4182 Altered mental status, unspecified: Secondary | ICD-10-CM | POA: Diagnosis not present

## 2016-04-12 DIAGNOSIS — J449 Chronic obstructive pulmonary disease, unspecified: Secondary | ICD-10-CM | POA: Diagnosis not present

## 2016-04-13 DIAGNOSIS — Z23 Encounter for immunization: Secondary | ICD-10-CM | POA: Diagnosis not present

## 2016-04-13 DIAGNOSIS — J441 Chronic obstructive pulmonary disease with (acute) exacerbation: Secondary | ICD-10-CM | POA: Diagnosis not present

## 2016-04-13 DIAGNOSIS — I10 Essential (primary) hypertension: Secondary | ICD-10-CM | POA: Diagnosis not present

## 2016-04-13 DIAGNOSIS — R4182 Altered mental status, unspecified: Secondary | ICD-10-CM | POA: Diagnosis not present

## 2016-05-01 DIAGNOSIS — Z9989 Dependence on other enabling machines and devices: Secondary | ICD-10-CM | POA: Diagnosis not present

## 2016-05-01 DIAGNOSIS — N3946 Mixed incontinence: Secondary | ICD-10-CM | POA: Diagnosis not present

## 2016-05-01 DIAGNOSIS — I10 Essential (primary) hypertension: Secondary | ICD-10-CM | POA: Diagnosis not present

## 2016-05-01 DIAGNOSIS — G4733 Obstructive sleep apnea (adult) (pediatric): Secondary | ICD-10-CM | POA: Diagnosis not present

## 2016-05-01 DIAGNOSIS — I509 Heart failure, unspecified: Secondary | ICD-10-CM | POA: Diagnosis not present

## 2016-05-01 DIAGNOSIS — J449 Chronic obstructive pulmonary disease, unspecified: Secondary | ICD-10-CM | POA: Diagnosis not present

## 2016-05-01 DIAGNOSIS — R569 Unspecified convulsions: Secondary | ICD-10-CM | POA: Diagnosis not present

## 2016-06-04 ENCOUNTER — Ambulatory Visit (INDEPENDENT_AMBULATORY_CARE_PROVIDER_SITE_OTHER): Payer: Medicare Other | Admitting: Orthopedic Surgery

## 2016-06-04 ENCOUNTER — Encounter: Payer: Self-pay | Admitting: Orthopedic Surgery

## 2016-06-04 ENCOUNTER — Ambulatory Visit (INDEPENDENT_AMBULATORY_CARE_PROVIDER_SITE_OTHER): Payer: Medicare Other

## 2016-06-04 VITALS — BP 151/67 | HR 70 | Wt 205.0 lb

## 2016-06-04 DIAGNOSIS — M25561 Pain in right knee: Secondary | ICD-10-CM

## 2016-06-04 DIAGNOSIS — M170 Bilateral primary osteoarthritis of knee: Secondary | ICD-10-CM | POA: Diagnosis not present

## 2016-06-04 NOTE — Patient Instructions (Addendum)
You have received an injection of steroids into the joint. 15% of patients will have increased pain within the 24 hours postinjection.   This is transient and will go away.   We recommend that you use ice packs on the injection site for 20 minutes every 2 hours and extra strength Tylenol 2 tablets every 8 as needed until the pain resolves.  If you continue to have pain after taking the Tylenol and using the ice please call the office for further instructions.   Joint Pain Joint pain can be caused by many things. The joint can be bruised, infected, weak from aging, or sore from exercise. The pain will probably go away if you follow your doctor's instructions for home care. If your joint pain continues, more tests may be needed to help find the cause of your condition. Follow these instructions at home: Watch your condition for any changes. Follow these instructions as told to lessen the pain that you are feeling:  Take medicines only as told by your doctor.  Rest the sore joint for as [place] as told by your doctor. If your doctor tells you to, raise (elevate) the painful joint above the level of your heart while you are sitting or lying down.  Do not do things that cause pain or make the pain worse.  If told, put ice on the painful area:  Put ice in a plastic bag.  Place a towel between your skin and the bag.  Leave the ice on for 20 minutes, 2-3 times per day.  Wear an elastic bandage, splint, or sling as told by your doctor. Loosen the bandage or splint if your fingers or toes lose feeling (become numb) and tingle, or if they turn cold and blue.  Begin exercising or stretching the joint as told by your doctor. Ask your doctor what types of exercise are safe for you.  Keep all follow-up visits as told by your doctor. This is important. Contact a doctor if:  Your pain gets worse and medicine does not help it.  Your joint pain does not get better in 3 days.  You have more bruising  or swelling.  You have a fever.  You lose 10 pounds (4.5 kg) or more without trying. Get help right away if:  You are not able to move the joint.  Your fingers or toes become numb or they turn cold and blue. This information is not intended to replace advice given to you by your health care provider. Make sure you discuss any questions you have with your health care provider. Document Released: 01/09/2009 Document Revised: 06/29/2015 Document Reviewed: 11/02/2013 Elsevier Interactive Patient Education  2017 [employer].

## 2016-06-04 NOTE — Progress Notes (Signed)
NEW PATIENT OFFICE VISIT    Chief Complaint  Patient presents with  . Knee Pain    bilateral RT>LT    61 year old female with history of back problems, on oxycodone 10 mg presents with several month 2 year history of severe medial and diffuse dull aching right knee pain with no prior treatment. She does use a walker because her back is bad. She denies treatment for that in the past      Review of Systems  Constitutional: Negative for fever and malaise/fatigue.  Musculoskeletal: Positive for back pain and joint pain.       Right leg gives way  Neurological: Negative for tingling and focal weakness.     Past Medical History:  Diagnosis Date  . Arthritis   . CHF (congestive heart failure) ([place])   . Chronic back pain   . COPD (chronic obstructive pulmonary disease) ([place])   . DJD (degenerative joint disease)    to knees  . Drug-seeking behavior 2007  . Hypertension   . Polysubstance abuse 2007  . Seizures ([place])     Past Surgical History:  Procedure Laterality Date  . CATARACT EXTRACTION    . CESAREAN SECTION    . CHOLECYSTECTOMY    . HERNIA REPAIR      No family history on file. Social History  Substance Use Topics  . Smoking status: Current Some Day Smoker    Types: Cigarettes  . Smokeless tobacco: Never Used  . Alcohol use No    BP (!) 151/67   Pulse 70   Wt 205 lb (93 kg)   BMI 33.09 kg/m   Physical Exam  Constitutional: She is oriented to person, place, and time. She appears well-developed and well-nourished. No distress.  Cardiovascular: Normal rate and intact distal pulses.   Neurological: She is alert and oriented to person, place, and time. She has normal reflexes. She exhibits normal muscle tone. Coordination normal.  Skin: Skin is warm and dry. No rash noted. She is not diaphoretic. No erythema. No pallor.  Psychiatric: She has a normal mood and affect. Her behavior is normal. Judgment and thought content normal.    Right Knee Exam    Tenderness  The patient is experiencing tenderness in the medial joint line.  Range of Motion  Extension: normal  Flexion: normal   Muscle Strength   The patient has normal right knee strength.  Tests  Mc[place]ray:  Medial - negative Lateral - negative Drawer:       Anterior - negative    Posterior - negative Varus: negative Valgus: negative  Other  Erythema: absent Scars: absent Sensation: normal Pulse: present Swelling: none   Left Knee Exam   Tenderness  The patient is experiencing tenderness in the medial joint line.  Range of Motion  Extension: normal  Flexion: normal   Muscle Strength   The patient has normal left knee strength.  Tests  McMurray:  Medial - negative Lateral - negative Drawer:       Anterior - negative     Posterior - negative Varus: negative Valgus: negative  Other  Erythema: absent Scars: absent Sensation: normal Pulse: present Swelling: none      Meds ordered this encounter  Medications  . cetirizine (ZYRTEC) 10 MG tablet    Sig: Take 10 mg by mouth daily.  Marland Kitchen oxybutynin (DITROPAN-XL) 5 MG 24 hr tablet    Sig: Take 5 mg by mouth at bedtime.  Marland Kitchen ipratropium-albuterol (DUONEB) 0.5-2.5 (3) MG/3ML SOLN  Sig: Take 3 mLs by nebulization.  . diclofenac (VOLTAREN) 75 MG EC tablet    Sig: Take 75 mg by mouth 2 (two) times daily.  Marland Kitchen amitriptyline (ELAVIL) 10 MG tablet    Sig: Take 10 mg by mouth at bedtime.  . predniSONE (DELTASONE) 20 MG tablet    Sig: Take 20 mg by mouth 2 (two) times daily with a meal.  . furosemide (LASIX) 20 MG tablet    Sig: Take 20 mg by mouth.  . baclofen (LIORESAL) 10 MG tablet    Sig: Take 10 mg by mouth 3 (three) times daily.  . pantoprazole (PROTONIX) 40 MG tablet    Sig: Take 40 mg by mouth daily.    Encounter Diagnosis  Name Primary?  . Right knee pain, unspecified chronicity Yes   She is not a surgical candidate for any orthopedic surgery at this time. She is on chronic opiate therapy she  still exhibited drug-seeking behavior in the past according to her records and she has a bad back. I did not operate on knee or hip arthritis unless the back situation can be corrected  PLAN:   I injected her right knee I placed her in a hinged knee brace  I recommend she see a spine specialist for treatment of her back problem and if she can get that fixed and I'll be happy to see her again for evaluation for treatment on her knee surgical or otherwise

## 2016-12-29 DIAGNOSIS — Z743 Need for continuous supervision: Secondary | ICD-10-CM | POA: Diagnosis not present

## 2016-12-29 DIAGNOSIS — R569 Unspecified convulsions: Secondary | ICD-10-CM | POA: Diagnosis not present

## 2016-12-29 DIAGNOSIS — Z9114 Patient's other noncompliance with medication regimen: Secondary | ICD-10-CM | POA: Diagnosis not present

## 2016-12-29 DIAGNOSIS — G40909 Epilepsy, unspecified, not intractable, without status epilepticus: Secondary | ICD-10-CM | POA: Diagnosis not present

## 2016-12-29 DIAGNOSIS — F17200 Nicotine dependence, unspecified, uncomplicated: Secondary | ICD-10-CM | POA: Diagnosis not present

## 2016-12-29 DIAGNOSIS — F6389 Other impulse disorders: Secondary | ICD-10-CM | POA: Diagnosis not present

## 2016-12-29 DIAGNOSIS — R402421 Glasgow coma scale score 9-12, in the field [EMT or ambulance]: Secondary | ICD-10-CM | POA: Diagnosis not present

## 2016-12-29 DIAGNOSIS — F4489 Other dissociative and conversion disorders: Secondary | ICD-10-CM | POA: Diagnosis not present

## 2016-12-29 DIAGNOSIS — R279 Unspecified lack of coordination: Secondary | ICD-10-CM | POA: Diagnosis not present

## 2017-01-01 DIAGNOSIS — R112 Nausea with vomiting, unspecified: Secondary | ICD-10-CM | POA: Diagnosis not present

## 2017-01-01 DIAGNOSIS — I509 Heart failure, unspecified: Secondary | ICD-10-CM | POA: Diagnosis not present

## 2017-01-01 DIAGNOSIS — I160 Hypertensive urgency: Secondary | ICD-10-CM | POA: Diagnosis not present

## 2017-01-01 DIAGNOSIS — R1110 Vomiting, unspecified: Secondary | ICD-10-CM | POA: Diagnosis not present

## 2017-01-01 DIAGNOSIS — M25572 Pain in left ankle and joints of left foot: Secondary | ICD-10-CM | POA: Diagnosis not present

## 2017-01-01 DIAGNOSIS — R52 Pain, unspecified: Secondary | ICD-10-CM | POA: Diagnosis not present

## 2017-01-01 DIAGNOSIS — D45 Polycythemia vera: Secondary | ICD-10-CM | POA: Diagnosis not present

## 2017-01-01 DIAGNOSIS — I110 Hypertensive heart disease with heart failure: Secondary | ICD-10-CM | POA: Diagnosis not present

## 2017-01-01 DIAGNOSIS — I10 Essential (primary) hypertension: Secondary | ICD-10-CM | POA: Diagnosis not present

## 2017-01-01 DIAGNOSIS — F17200 Nicotine dependence, unspecified, uncomplicated: Secondary | ICD-10-CM | POA: Diagnosis not present

## 2017-01-01 DIAGNOSIS — K5730 Diverticulosis of large intestine without perforation or abscess without bleeding: Secondary | ICD-10-CM | POA: Diagnosis not present

## 2017-01-02 DIAGNOSIS — D45 Polycythemia vera: Secondary | ICD-10-CM | POA: Diagnosis present

## 2017-01-02 DIAGNOSIS — R1110 Vomiting, unspecified: Secondary | ICD-10-CM | POA: Diagnosis not present

## 2017-01-02 DIAGNOSIS — Z91041 Radiographic dye allergy status: Secondary | ICD-10-CM | POA: Diagnosis not present

## 2017-01-02 DIAGNOSIS — Z7982 Long term (current) use of aspirin: Secondary | ICD-10-CM | POA: Diagnosis not present

## 2017-01-02 DIAGNOSIS — M549 Dorsalgia, unspecified: Secondary | ICD-10-CM | POA: Diagnosis present

## 2017-01-02 DIAGNOSIS — Z8541 Personal history of malignant neoplasm of cervix uteri: Secondary | ICD-10-CM | POA: Diagnosis not present

## 2017-01-02 DIAGNOSIS — I160 Hypertensive urgency: Secondary | ICD-10-CM | POA: Diagnosis present

## 2017-01-02 DIAGNOSIS — G40909 Epilepsy, unspecified, not intractable, without status epilepticus: Secondary | ICD-10-CM | POA: Diagnosis present

## 2017-01-02 DIAGNOSIS — Z79899 Other long term (current) drug therapy: Secondary | ICD-10-CM | POA: Diagnosis not present

## 2017-01-02 DIAGNOSIS — K219 Gastro-esophageal reflux disease without esophagitis: Secondary | ICD-10-CM | POA: Diagnosis present

## 2017-01-02 DIAGNOSIS — G8929 Other chronic pain: Secondary | ICD-10-CM | POA: Diagnosis present

## 2017-01-02 DIAGNOSIS — I110 Hypertensive heart disease with heart failure: Secondary | ICD-10-CM | POA: Diagnosis present

## 2017-01-02 DIAGNOSIS — Z23 Encounter for immunization: Secondary | ICD-10-CM | POA: Diagnosis not present

## 2017-01-02 DIAGNOSIS — F17200 Nicotine dependence, unspecified, uncomplicated: Secondary | ICD-10-CM | POA: Diagnosis present

## 2017-01-02 DIAGNOSIS — I509 Heart failure, unspecified: Secondary | ICD-10-CM | POA: Diagnosis present

## 2017-01-02 DIAGNOSIS — M25572 Pain in left ankle and joints of left foot: Secondary | ICD-10-CM | POA: Diagnosis present

## 2017-01-02 DIAGNOSIS — R112 Nausea with vomiting, unspecified: Secondary | ICD-10-CM | POA: Diagnosis present

## 2017-01-02 DIAGNOSIS — I10 Essential (primary) hypertension: Secondary | ICD-10-CM | POA: Diagnosis not present

## 2017-01-02 DIAGNOSIS — Z91048 Other nonmedicinal substance allergy status: Secondary | ICD-10-CM | POA: Diagnosis not present

## 2017-01-02 DIAGNOSIS — J449 Chronic obstructive pulmonary disease, unspecified: Secondary | ICD-10-CM | POA: Diagnosis present

## 2017-03-06 DIAGNOSIS — K210 Gastro-esophageal reflux disease with esophagitis: Secondary | ICD-10-CM | POA: Diagnosis not present

## 2017-03-06 DIAGNOSIS — F3289 Other specified depressive episodes: Secondary | ICD-10-CM | POA: Diagnosis not present

## 2017-03-06 DIAGNOSIS — I10 Essential (primary) hypertension: Secondary | ICD-10-CM | POA: Diagnosis not present

## 2017-03-06 DIAGNOSIS — E7849 Other hyperlipidemia: Secondary | ICD-10-CM | POA: Diagnosis not present

## 2017-03-06 DIAGNOSIS — G40219 Localization-related (focal) (partial) symptomatic epilepsy and epileptic syndromes with complex partial seizures, intractable, without status epilepticus: Secondary | ICD-10-CM | POA: Diagnosis not present

## 2017-03-06 DIAGNOSIS — Z131 Encounter for screening for diabetes mellitus: Secondary | ICD-10-CM | POA: Diagnosis not present

## 2017-03-06 DIAGNOSIS — J441 Chronic obstructive pulmonary disease with (acute) exacerbation: Secondary | ICD-10-CM | POA: Diagnosis not present

## 2017-03-06 DIAGNOSIS — F329 Major depressive disorder, single episode, unspecified: Secondary | ICD-10-CM | POA: Diagnosis not present

## 2017-06-10 DIAGNOSIS — Z0000 Encounter for general adult medical examination without abnormal findings: Secondary | ICD-10-CM | POA: Diagnosis not present

## 2017-06-10 DIAGNOSIS — J441 Chronic obstructive pulmonary disease with (acute) exacerbation: Secondary | ICD-10-CM | POA: Diagnosis not present

## 2017-06-10 DIAGNOSIS — I10 Essential (primary) hypertension: Secondary | ICD-10-CM | POA: Diagnosis not present

## 2017-06-10 DIAGNOSIS — Z1389 Encounter for screening for other disorder: Secondary | ICD-10-CM | POA: Diagnosis not present

## 2017-06-10 DIAGNOSIS — G40219 Localization-related (focal) (partial) symptomatic epilepsy and epileptic syndromes with complex partial seizures, intractable, without status epilepticus: Secondary | ICD-10-CM | POA: Diagnosis not present

## 2017-06-10 DIAGNOSIS — F3289 Other specified depressive episodes: Secondary | ICD-10-CM | POA: Diagnosis not present

## 2017-06-10 DIAGNOSIS — K210 Gastro-esophageal reflux disease with esophagitis: Secondary | ICD-10-CM | POA: Diagnosis not present

## 2017-10-28 DIAGNOSIS — F3289 Other specified depressive episodes: Secondary | ICD-10-CM | POA: Diagnosis not present

## 2017-10-28 DIAGNOSIS — G40219 Localization-related (focal) (partial) symptomatic epilepsy and epileptic syndromes with complex partial seizures, intractable, without status epilepticus: Secondary | ICD-10-CM | POA: Diagnosis not present

## 2017-10-28 DIAGNOSIS — J441 Chronic obstructive pulmonary disease with (acute) exacerbation: Secondary | ICD-10-CM | POA: Diagnosis not present

## 2017-10-28 DIAGNOSIS — I10 Essential (primary) hypertension: Secondary | ICD-10-CM | POA: Diagnosis not present

## 2017-10-28 DIAGNOSIS — K210 Gastro-esophageal reflux disease with esophagitis: Secondary | ICD-10-CM | POA: Diagnosis not present

## 2018-03-10 DIAGNOSIS — F3289 Other specified depressive episodes: Secondary | ICD-10-CM | POA: Diagnosis not present

## 2018-03-10 DIAGNOSIS — K210 Gastro-esophageal reflux disease with esophagitis: Secondary | ICD-10-CM | POA: Diagnosis not present

## 2018-03-10 DIAGNOSIS — J441 Chronic obstructive pulmonary disease with (acute) exacerbation: Secondary | ICD-10-CM | POA: Diagnosis not present

## 2018-03-10 DIAGNOSIS — M25561 Pain in right knee: Secondary | ICD-10-CM | POA: Diagnosis not present

## 2018-03-10 DIAGNOSIS — I10 Essential (primary) hypertension: Secondary | ICD-10-CM | POA: Diagnosis not present

## 2018-05-16 IMAGING — CT CT HEAD W/O CM
3 series · 16 of 46 positions shown, 19 images · non-contrast
Comparison: 11/03/2005.

CLINICAL DATA: Altered mental status.  Fell yesterday.

EXAM:
CT HEAD WITHOUT CONTRAST
TECHNIQUE: Contiguous axial images were obtained from the base of the skull
through the vertex without intravenous contrast.

[Series 3: head trauma wo · axial · 0.48mm/px · z∈[+220,+340]mm · 10 of 29 slices shown, 13 images]
[im 3/29  brain]
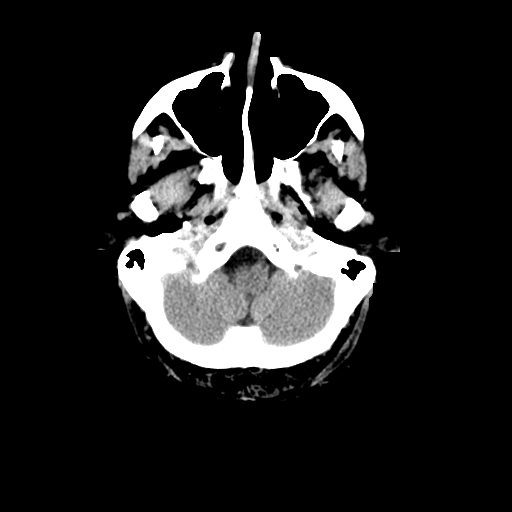
[im 3/29  bone]
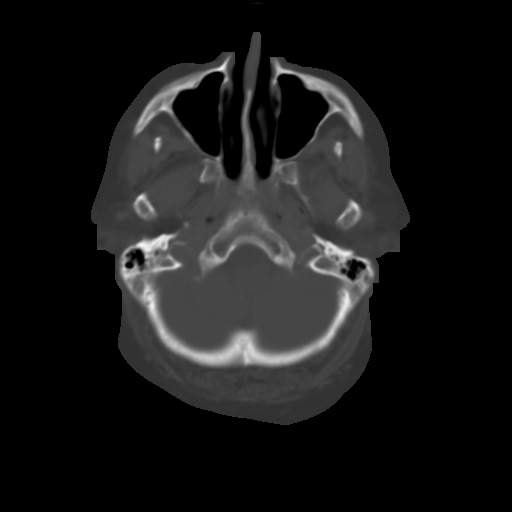
[im 6/29  brain]
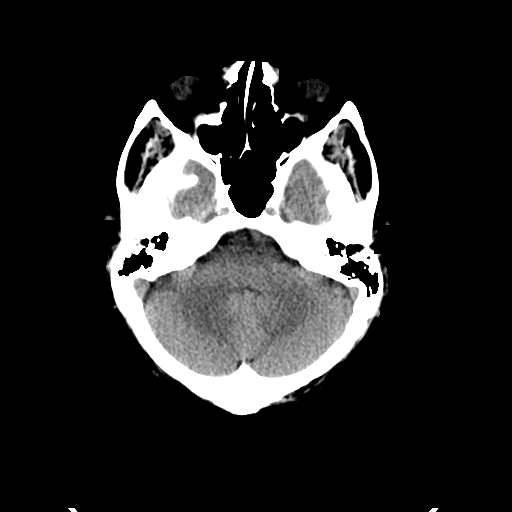
[im 8/29  brain]
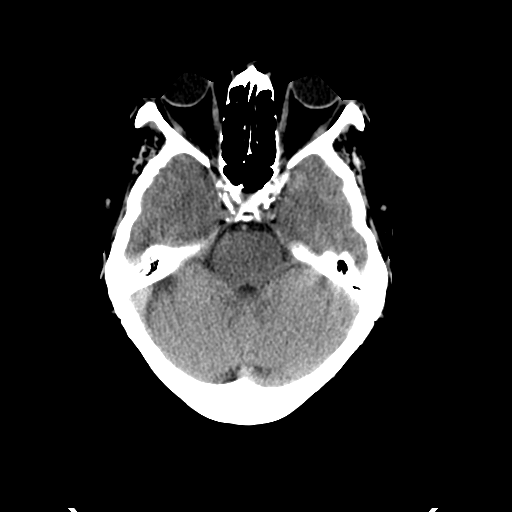
[im 11/29  brain]
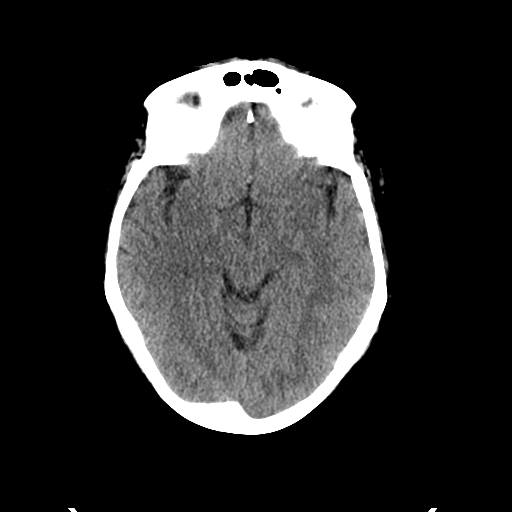
[im 14/29  brain]
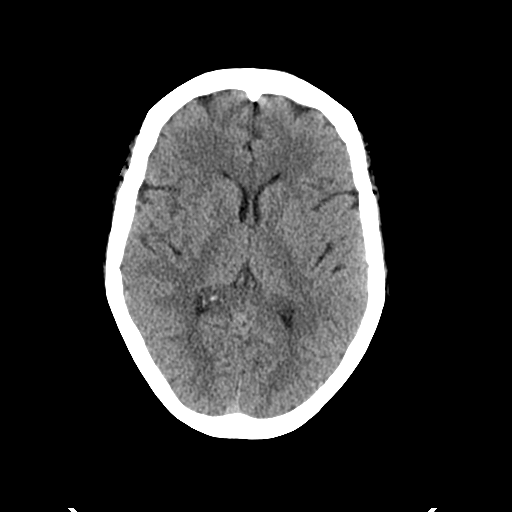
[im 14/29  bone]
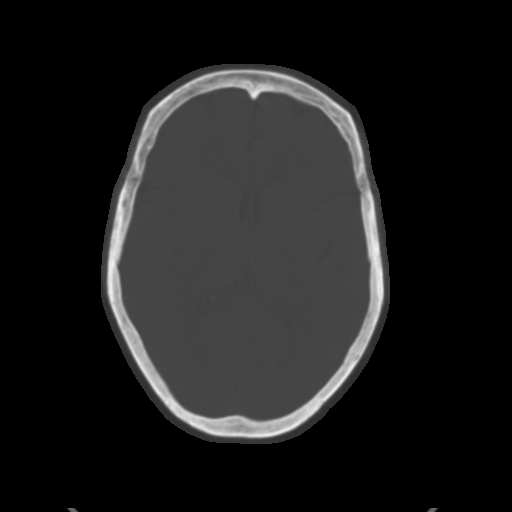
[im 16/29  brain]
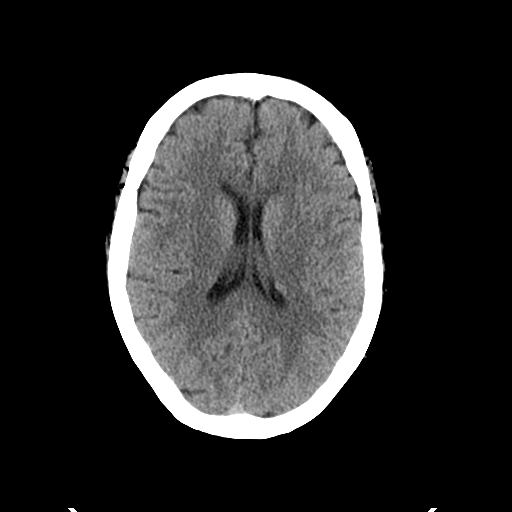
[im 19/29  brain]
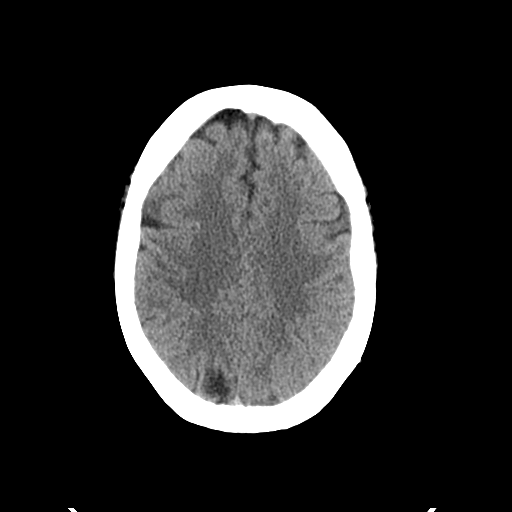
[im 22/29  brain]
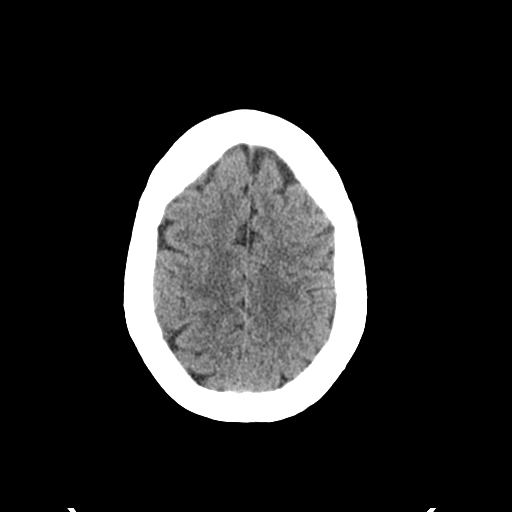
[im 24/29  brain]
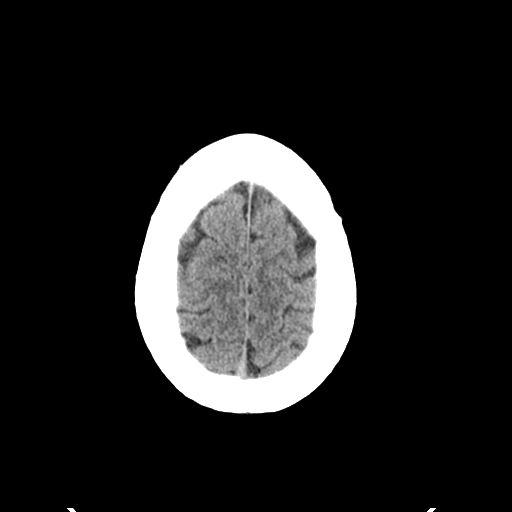
[im 24/29  bone]
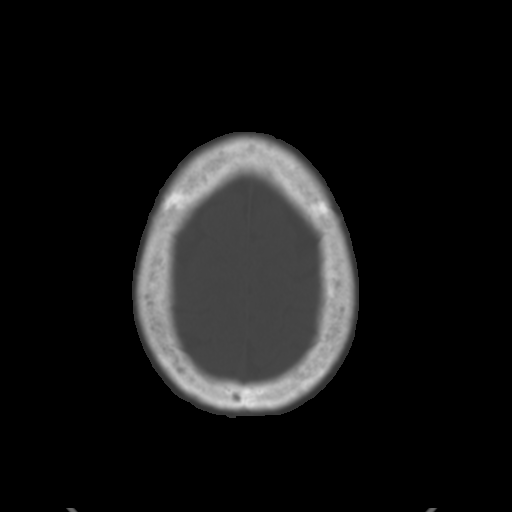
[im 27/29  brain]
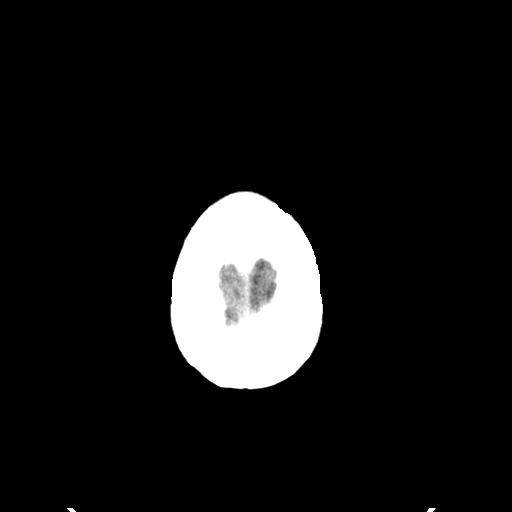

[Series 5: coronal soft tissue · coronal · 0.33mm/px · 3 of 66 slices shown]
[im 22/66  brain]
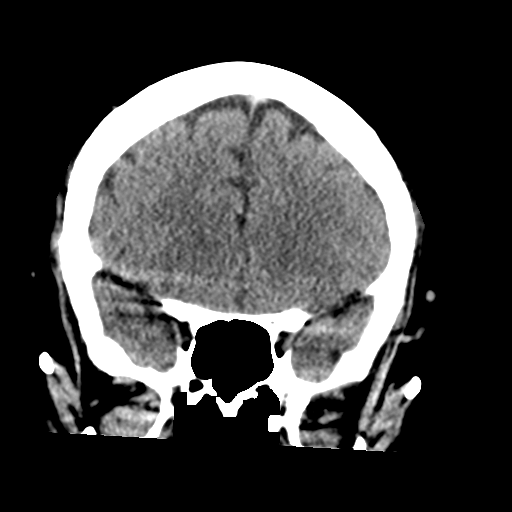
[im 29/66  brain]
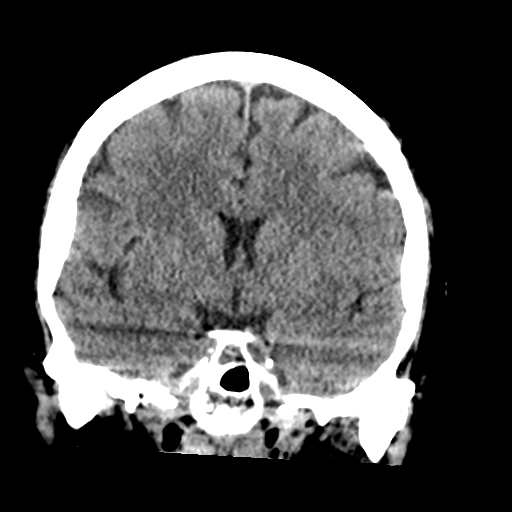
[im 37/66  brain]
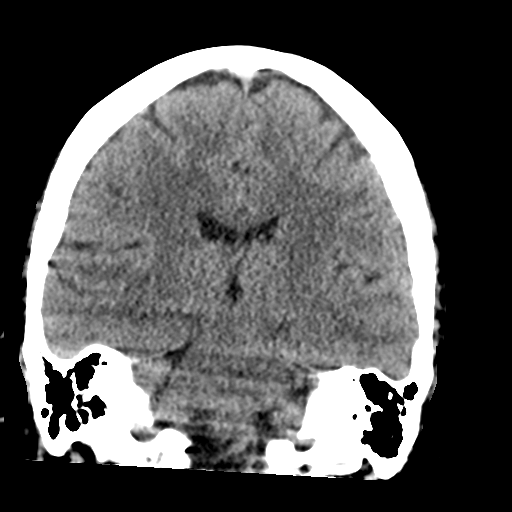

[Series 6: sagittal soft tissue · sagittal · 0.31mm/px · 3 of 49 slices shown]
[im 17/49  brain]
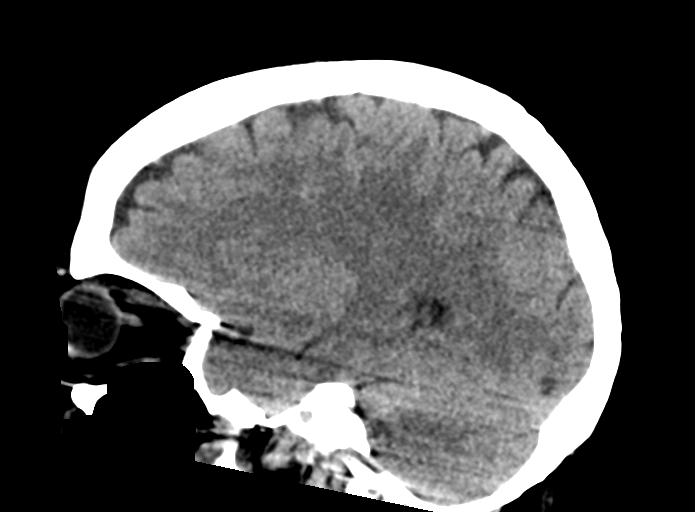
[im 25/49  brain]
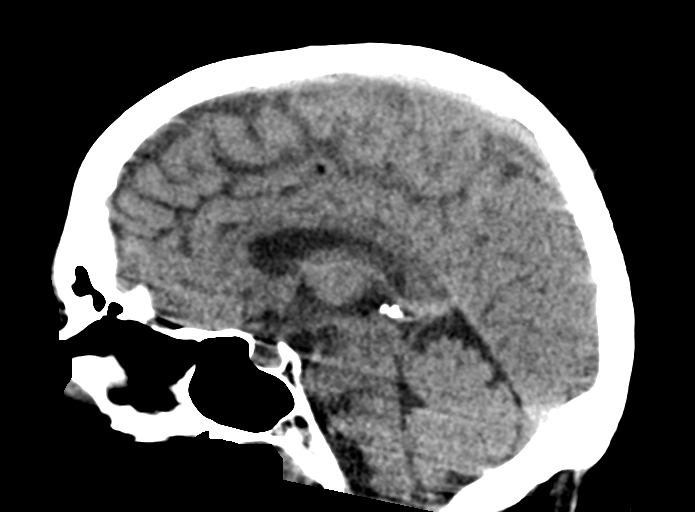
[im 33/49  brain]
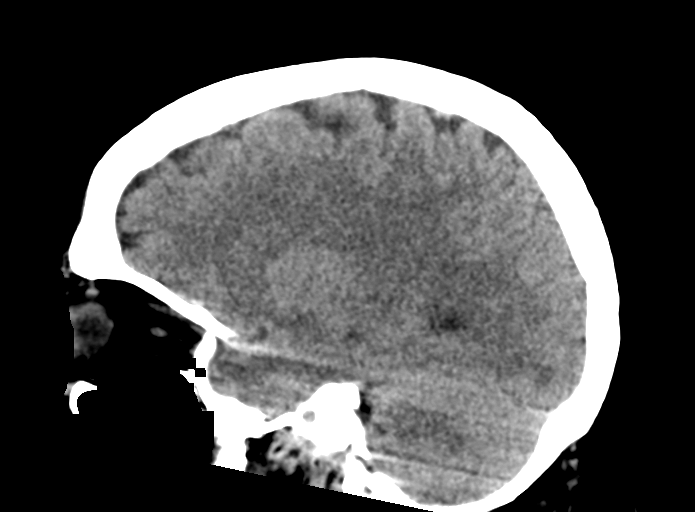

[16 of 46 positions shown; findings below may reference images not displayed]

FINDINGS: Brain: No evidence of acute infarction, hemorrhage, hydrocephalus,
extra-axial collection or mass lesion/mass effect. Focal
encephalomalacia consistent with remote infarction, posterior right
parietal region. This was present by report on 01/14/2012. Gray
matter and white matter are otherwise unremarkable, with normal
differentiation.

Vascular: No hyperdense vessel or unexpected calcification.

Skull: Normal. Negative for fracture or focal lesion.

Sinuses/Orbits: No acute finding.

Other: None.
IMPRESSION: Remote posterior right parietal infarction. No acute intracranial
findings.

## 2018-07-03 DIAGNOSIS — E785 Hyperlipidemia, unspecified: Secondary | ICD-10-CM | POA: Diagnosis not present

## 2018-07-03 DIAGNOSIS — F3289 Other specified depressive episodes: Secondary | ICD-10-CM | POA: Diagnosis not present

## 2018-07-03 DIAGNOSIS — I10 Essential (primary) hypertension: Secondary | ICD-10-CM | POA: Diagnosis not present

## 2018-07-03 DIAGNOSIS — J449 Chronic obstructive pulmonary disease, unspecified: Secondary | ICD-10-CM | POA: Diagnosis not present

## 2018-07-03 DIAGNOSIS — M542 Cervicalgia: Secondary | ICD-10-CM | POA: Diagnosis not present

## 2018-07-03 DIAGNOSIS — K210 Gastro-esophageal reflux disease with esophagitis: Secondary | ICD-10-CM | POA: Diagnosis not present

## 2018-10-14 DIAGNOSIS — F3289 Other specified depressive episodes: Secondary | ICD-10-CM | POA: Diagnosis not present

## 2018-10-14 DIAGNOSIS — M542 Cervicalgia: Secondary | ICD-10-CM | POA: Diagnosis not present

## 2018-10-14 DIAGNOSIS — Z1389 Encounter for screening for other disorder: Secondary | ICD-10-CM | POA: Diagnosis not present

## 2018-10-14 DIAGNOSIS — I10 Essential (primary) hypertension: Secondary | ICD-10-CM | POA: Diagnosis not present

## 2018-10-14 DIAGNOSIS — K210 Gastro-esophageal reflux disease with esophagitis: Secondary | ICD-10-CM | POA: Diagnosis not present

## 2018-10-14 DIAGNOSIS — J449 Chronic obstructive pulmonary disease, unspecified: Secondary | ICD-10-CM | POA: Diagnosis not present

## 2018-10-14 DIAGNOSIS — E785 Hyperlipidemia, unspecified: Secondary | ICD-10-CM | POA: Diagnosis not present

## 2018-10-14 DIAGNOSIS — Z0000 Encounter for general adult medical examination without abnormal findings: Secondary | ICD-10-CM | POA: Diagnosis not present

## 2018-12-10 DIAGNOSIS — J9621 Acute and chronic respiratory failure with hypoxia: Secondary | ICD-10-CM | POA: Diagnosis present

## 2018-12-10 DIAGNOSIS — J441 Chronic obstructive pulmonary disease with (acute) exacerbation: Secondary | ICD-10-CM | POA: Diagnosis present

## 2018-12-10 DIAGNOSIS — G8929 Other chronic pain: Secondary | ICD-10-CM | POA: Diagnosis present

## 2018-12-10 DIAGNOSIS — Z20828 Contact with and (suspected) exposure to other viral communicable diseases: Secondary | ICD-10-CM | POA: Diagnosis not present

## 2018-12-10 DIAGNOSIS — Z720 Tobacco use: Secondary | ICD-10-CM | POA: Diagnosis not present

## 2018-12-10 DIAGNOSIS — I10 Essential (primary) hypertension: Secondary | ICD-10-CM | POA: Diagnosis not present

## 2018-12-10 DIAGNOSIS — J189 Pneumonia, unspecified organism: Secondary | ICD-10-CM | POA: Diagnosis not present

## 2018-12-10 DIAGNOSIS — Z23 Encounter for immunization: Secondary | ICD-10-CM | POA: Diagnosis not present

## 2018-12-10 DIAGNOSIS — R069 Unspecified abnormalities of breathing: Secondary | ICD-10-CM | POA: Diagnosis not present

## 2018-12-10 DIAGNOSIS — F17210 Nicotine dependence, cigarettes, uncomplicated: Secondary | ICD-10-CM | POA: Diagnosis present

## 2018-12-10 DIAGNOSIS — E669 Obesity, unspecified: Secondary | ICD-10-CM | POA: Diagnosis present

## 2018-12-10 DIAGNOSIS — G40909 Epilepsy, unspecified, not intractable, without status epilepticus: Secondary | ICD-10-CM | POA: Diagnosis present

## 2018-12-10 DIAGNOSIS — D72829 Elevated white blood cell count, unspecified: Secondary | ICD-10-CM | POA: Diagnosis not present

## 2018-12-10 DIAGNOSIS — F419 Anxiety disorder, unspecified: Secondary | ICD-10-CM | POA: Diagnosis present

## 2018-12-10 DIAGNOSIS — J449 Chronic obstructive pulmonary disease, unspecified: Secondary | ICD-10-CM | POA: Diagnosis not present

## 2018-12-10 DIAGNOSIS — J9600 Acute respiratory failure, unspecified whether with hypoxia or hypercapnia: Secondary | ICD-10-CM | POA: Diagnosis not present

## 2018-12-10 DIAGNOSIS — Z8541 Personal history of malignant neoplasm of cervix uteri: Secondary | ICD-10-CM | POA: Diagnosis not present

## 2018-12-10 DIAGNOSIS — R0689 Other abnormalities of breathing: Secondary | ICD-10-CM | POA: Diagnosis not present

## 2018-12-10 DIAGNOSIS — Z8701 Personal history of pneumonia (recurrent): Secondary | ICD-10-CM | POA: Diagnosis not present

## 2018-12-10 DIAGNOSIS — D45 Polycythemia vera: Secondary | ICD-10-CM | POA: Diagnosis present

## 2018-12-10 DIAGNOSIS — J9620 Acute and chronic respiratory failure, unspecified whether with hypoxia or hypercapnia: Secondary | ICD-10-CM | POA: Diagnosis not present

## 2018-12-10 DIAGNOSIS — R410 Disorientation, unspecified: Secondary | ICD-10-CM | POA: Diagnosis not present

## 2018-12-10 DIAGNOSIS — R404 Transient alteration of awareness: Secondary | ICD-10-CM | POA: Diagnosis not present

## 2018-12-10 DIAGNOSIS — M549 Dorsalgia, unspecified: Secondary | ICD-10-CM | POA: Diagnosis present

## 2018-12-10 DIAGNOSIS — F17200 Nicotine dependence, unspecified, uncomplicated: Secondary | ICD-10-CM | POA: Diagnosis not present

## 2018-12-10 DIAGNOSIS — I509 Heart failure, unspecified: Secondary | ICD-10-CM | POA: Diagnosis present

## 2018-12-10 DIAGNOSIS — Z880 Allergy status to penicillin: Secondary | ICD-10-CM | POA: Diagnosis not present

## 2018-12-10 DIAGNOSIS — I110 Hypertensive heart disease with heart failure: Secondary | ICD-10-CM | POA: Diagnosis present

## 2018-12-10 DIAGNOSIS — Z7982 Long term (current) use of aspirin: Secondary | ICD-10-CM | POA: Diagnosis not present

## 2018-12-10 DIAGNOSIS — G4733 Obstructive sleep apnea (adult) (pediatric): Secondary | ICD-10-CM | POA: Diagnosis present

## 2018-12-10 DIAGNOSIS — J9811 Atelectasis: Secondary | ICD-10-CM | POA: Diagnosis not present

## 2018-12-10 DIAGNOSIS — J9622 Acute and chronic respiratory failure with hypercapnia: Secondary | ICD-10-CM | POA: Diagnosis not present

## 2018-12-10 DIAGNOSIS — R0902 Hypoxemia: Secondary | ICD-10-CM | POA: Diagnosis not present

## 2018-12-10 DIAGNOSIS — R5381 Other malaise: Secondary | ICD-10-CM | POA: Diagnosis present

## 2018-12-10 DIAGNOSIS — R4182 Altered mental status, unspecified: Secondary | ICD-10-CM | POA: Diagnosis not present

## 2018-12-10 DIAGNOSIS — E871 Hypo-osmolality and hyponatremia: Secondary | ICD-10-CM | POA: Diagnosis not present

## 2018-12-10 DIAGNOSIS — J440 Chronic obstructive pulmonary disease with acute lower respiratory infection: Secondary | ICD-10-CM | POA: Diagnosis present

## 2018-12-10 DIAGNOSIS — R7989 Other specified abnormal findings of blood chemistry: Secondary | ICD-10-CM | POA: Diagnosis not present

## 2018-12-10 DIAGNOSIS — E875 Hyperkalemia: Secondary | ICD-10-CM | POA: Diagnosis not present

## 2018-12-10 DIAGNOSIS — K219 Gastro-esophageal reflux disease without esophagitis: Secondary | ICD-10-CM | POA: Diagnosis present

## 2018-12-10 DIAGNOSIS — F05 Delirium due to known physiological condition: Secondary | ICD-10-CM | POA: Diagnosis not present

## 2019-02-09 DIAGNOSIS — J449 Chronic obstructive pulmonary disease, unspecified: Secondary | ICD-10-CM | POA: Diagnosis not present

## 2019-04-12 DIAGNOSIS — R69 Illness, unspecified: Secondary | ICD-10-CM | POA: Diagnosis not present

## 2019-04-12 DIAGNOSIS — I10 Essential (primary) hypertension: Secondary | ICD-10-CM | POA: Diagnosis not present

## 2019-04-12 DIAGNOSIS — M542 Cervicalgia: Secondary | ICD-10-CM | POA: Diagnosis not present

## 2019-04-12 DIAGNOSIS — K2100 Gastro-esophageal reflux disease with esophagitis, without bleeding: Secondary | ICD-10-CM | POA: Diagnosis not present

## 2019-04-12 DIAGNOSIS — Z0000 Encounter for general adult medical examination without abnormal findings: Secondary | ICD-10-CM | POA: Diagnosis not present

## 2019-04-12 DIAGNOSIS — E785 Hyperlipidemia, unspecified: Secondary | ICD-10-CM | POA: Diagnosis not present

## 2019-04-12 DIAGNOSIS — J449 Chronic obstructive pulmonary disease, unspecified: Secondary | ICD-10-CM | POA: Diagnosis not present

## 2019-07-21 DIAGNOSIS — M542 Cervicalgia: Secondary | ICD-10-CM | POA: Diagnosis not present

## 2019-07-21 DIAGNOSIS — K2100 Gastro-esophageal reflux disease with esophagitis, without bleeding: Secondary | ICD-10-CM | POA: Diagnosis not present

## 2019-07-21 DIAGNOSIS — R69 Illness, unspecified: Secondary | ICD-10-CM | POA: Diagnosis not present

## 2019-07-21 DIAGNOSIS — E785 Hyperlipidemia, unspecified: Secondary | ICD-10-CM | POA: Diagnosis not present

## 2019-07-21 DIAGNOSIS — Z6830 Body mass index (BMI) 30.0-30.9, adult: Secondary | ICD-10-CM | POA: Diagnosis not present

## 2019-07-21 DIAGNOSIS — J449 Chronic obstructive pulmonary disease, unspecified: Secondary | ICD-10-CM | POA: Diagnosis not present

## 2019-07-21 DIAGNOSIS — I10 Essential (primary) hypertension: Secondary | ICD-10-CM | POA: Diagnosis not present

## 2019-10-06 DEATH — deceased
